# Patient Record
Sex: Male | Born: 1965
Health system: Southern US, Community
[De-identification: ages and names within clinical notes are randomized; demographics above are authoritative.]

## PROBLEM LIST (undated history)

## (undated) DIAGNOSIS — M542 Cervicalgia: Secondary | ICD-10-CM

## (undated) DIAGNOSIS — F4310 Post-traumatic stress disorder, unspecified: Secondary | ICD-10-CM

## (undated) DIAGNOSIS — F32A Depression, unspecified: Secondary | ICD-10-CM

## (undated) DIAGNOSIS — F329 Major depressive disorder, single episode, unspecified: Secondary | ICD-10-CM

## (undated) DIAGNOSIS — F17200 Nicotine dependence, unspecified, uncomplicated: Secondary | ICD-10-CM

## (undated) DIAGNOSIS — G8929 Other chronic pain: Secondary | ICD-10-CM

## (undated) DIAGNOSIS — E782 Mixed hyperlipidemia: Secondary | ICD-10-CM

## (undated) DIAGNOSIS — M549 Dorsalgia, unspecified: Secondary | ICD-10-CM

## (undated) HISTORY — DX: Mixed hyperlipidemia: E78.2

## (undated) HISTORY — DX: Nicotine dependence, unspecified, uncomplicated: F17.200

## (undated) HISTORY — PX: BACK SURGERY: SHX140

## (undated) HISTORY — PX: APPENDECTOMY: SHX54

## (undated) HISTORY — PX: WISDOM TOOTH EXTRACTION: SHX21

## (undated) HISTORY — DX: Major depressive disorder, single episode, unspecified: F32.9

## (undated) HISTORY — DX: Cervicalgia: M54.2

## (undated) HISTORY — DX: Depression, unspecified: F32.A

## (undated) HISTORY — PX: VASECTOMY: SHX75

## (undated) HISTORY — PX: TONSILLECTOMY: SUR1361

---

## 1999-04-12 ENCOUNTER — Encounter (INDEPENDENT_AMBULATORY_CARE_PROVIDER_SITE_OTHER): Payer: Self-pay | Admitting: Specialist

## 1999-04-12 ENCOUNTER — Other Ambulatory Visit: Admission: RE | Admit: 1999-04-12 | Discharge: 1999-04-12 | Payer: Self-pay | Admitting: Specialist

## 2000-01-05 ENCOUNTER — Encounter: Payer: Self-pay | Admitting: Specialist

## 2000-01-06 ENCOUNTER — Encounter (INDEPENDENT_AMBULATORY_CARE_PROVIDER_SITE_OTHER): Payer: Self-pay | Admitting: Specialist

## 2000-01-06 ENCOUNTER — Observation Stay (HOSPITAL_COMMUNITY): Admission: RE | Admit: 2000-01-06 | Discharge: 2000-01-07 | Payer: Self-pay | Admitting: Specialist

## 2000-01-06 ENCOUNTER — Encounter: Payer: Self-pay | Admitting: Specialist

## 2000-06-08 ENCOUNTER — Encounter: Payer: Self-pay | Admitting: Specialist

## 2000-06-09 ENCOUNTER — Inpatient Hospital Stay (HOSPITAL_COMMUNITY): Admission: EM | Admit: 2000-06-09 | Discharge: 2000-06-11 | Payer: Self-pay | Admitting: Specialist

## 2009-06-11 ENCOUNTER — Emergency Department (HOSPITAL_BASED_OUTPATIENT_CLINIC_OR_DEPARTMENT_OTHER): Admission: EM | Admit: 2009-06-11 | Discharge: 2009-06-11 | Payer: Self-pay | Admitting: Emergency Medicine

## 2009-08-23 ENCOUNTER — Encounter
Admission: RE | Admit: 2009-08-23 | Discharge: 2009-11-21 | Payer: Self-pay | Admitting: Physical Medicine & Rehabilitation

## 2009-08-30 ENCOUNTER — Ambulatory Visit: Payer: Self-pay | Admitting: Physical Medicine & Rehabilitation

## 2009-09-28 ENCOUNTER — Ambulatory Visit: Payer: Self-pay | Admitting: Physical Medicine & Rehabilitation

## 2009-10-22 ENCOUNTER — Ambulatory Visit: Payer: Self-pay | Admitting: Physical Medicine & Rehabilitation

## 2009-11-02 ENCOUNTER — Ambulatory Visit: Payer: Self-pay | Admitting: Diagnostic Radiology

## 2009-11-02 ENCOUNTER — Emergency Department (HOSPITAL_BASED_OUTPATIENT_CLINIC_OR_DEPARTMENT_OTHER): Admission: EM | Admit: 2009-11-02 | Discharge: 2009-11-02 | Payer: Self-pay | Admitting: Emergency Medicine

## 2009-11-12 ENCOUNTER — Emergency Department (HOSPITAL_BASED_OUTPATIENT_CLINIC_OR_DEPARTMENT_OTHER): Admission: EM | Admit: 2009-11-12 | Discharge: 2009-11-12 | Payer: Self-pay | Admitting: Emergency Medicine

## 2009-11-12 ENCOUNTER — Emergency Department (HOSPITAL_COMMUNITY): Admission: EM | Admit: 2009-11-12 | Discharge: 2009-11-12 | Payer: Self-pay | Admitting: Emergency Medicine

## 2010-04-21 LAB — BASIC METABOLIC PANEL
CO2: 25 mEq/L (ref 19–32)
Calcium: 10 mg/dL (ref 8.4–10.5)
Calcium: 9.9 mg/dL (ref 8.4–10.5)
Chloride: 109 mEq/L (ref 96–112)
Creatinine, Ser: 0.8 mg/dL (ref 0.4–1.5)
GFR calc Af Amer: >60 mL/min (ref >60)
GFR calc Af Amer: >60 mL/min (ref >60)
GFR calc non Af Amer: >60 mL/min (ref >60)
Sodium: 141 mEq/L (ref 135–145)

## 2010-04-21 LAB — DIFFERENTIAL
Lymphocytes Relative: 24 % (ref 12–46)
Lymphs Abs: 1.8 10*3/uL (ref 0.7–4.0)
Monocytes Relative: 7 % (ref 3–12)
Neutro Abs: 5 10*3/uL (ref 1.7–7.7)
Neutrophils Relative %: 65 % (ref 43–77)

## 2010-04-21 LAB — RAPID URINE DRUG SCREEN, HOSP PERFORMED
Amphetamines: NOT DETECTED
Barbiturates: NOT DETECTED
Benzodiazepines: POSITIVE — AB
Cocaine: NOT DETECTED
Opiates: NOT DETECTED
Tetrahydrocannabinol: NOT DETECTED

## 2010-04-21 LAB — CBC
Hemoglobin: 14.5 g/dL (ref 13.0–17.0)
RBC: 5.23 MIL/uL (ref 4.22–5.81)
WBC: 7.7 10*3/uL (ref 4.0–10.5)

## 2010-04-21 LAB — POCT CARDIAC MARKERS
CKMB, poc: <1 ng/mL — ABNORMAL LOW (ref 1.0–8.0)
Myoglobin, poc: 33.8 ng/mL (ref 12–200)
Myoglobin, poc: 36.5 ng/mL (ref 12–200)

## 2010-04-21 LAB — ETHANOL: Alcohol, Ethyl (B): <5 mg/dL (ref 0–10)

## 2010-06-24 NOTE — Op Note (Signed)
[facility]  Patient:    Karl Jacobs, Karl Jacobs                       MRN: [id number] Proc. Date: 01/06/00 Attending:  Javier Docker, M.D.                           Operative Report  PREOPERATIVE DIAGNOSES:  Recurrent disk herniation L5-S1, right.  POSTOPERATIVE DIAGNOSES:  Recurrent disk herniation L5-S1, right.  PROCEDURE:  Redo microdiskectomy L5-S1, right.  ANESTHESIA:  General.  ASSISTANT:  Avel Peace, P.A.-C.  BRIEF HISTORY:  A 45 year old with recurrent disk herniation confirmed with MRI with refractory S1 radiculopathy. Operative intervention was indicated for decompression of the S1 nerve root by repeat diskectomy. Risks and benefits discussed including bleeding, infection, injury to neurovascular structures, CSF leakage, epidural fibrosis, need for fusion in the future, no change in symptoms, worsening of symptoms, etc.  TECHNIQUE:  The patient in supine position after the induction of general anesthesia, 1 gm of Kefzol IV for antimicrobial prophylaxis. The patient was placed prone on the [place] frame. All bony prominences were well padded. The lumbar region was prepped and draped in the usual sterile fashion. A previous surgical scar was excised. The subcutaneous tissue was dissected, electrocautery was utilized to achieve hemostasis. The dorsolumbar fascia was identified and divided in line with the skin incision. The paraspinous muscle was elevated to the lamina of 4 and 5. Epidural fibrosis was noted. This was delineated with the lamina utilizing a curette so that that cephalad edge of S1 and caudad edge of 5 was skeletonized. The operating microscope was draped and brought onto the surgical field. A Penfield 4 was utilized to gently patch the epidural fibrosis from the facet and the inferior edge of the lamina of 5 as well as the cephalad edge of S1. The laminotomy was enlarged utilizing a 2 mm Kerrison. The S1 nerve root was  identified in the foramen and tracked proximally and was found to be under significant tension. The disk was identified and confirmed with the x-ray and the thecal sac and root were gently mobilized medially utilizing a Penfield 4 gently lysing adhesions for mobilization of the root and the thecal sac. A large herniated nucleus pulposus was noted with slight caudal migration. Annulotomy was performed. Copious portion of disk material was removed from the disk space and further mobilized with a curette and an Epstein. A combination of upbite and straight pituitaries were utilized to retrieve the disk herniation. No residual disk herniation material noted. A hockey stick probe placed in the foramen of 5 and S1 found to be widely patent following decompression. There was no residual pressure on the thecal sac or the S1 nerve root. The disk space and the lamina space was copiously irrigated. Inspection revealed no CSF leakage or active bleeding. The axilla beneath the root into the S1 foramen underneath the thecal sac were examined with no evidence of residual disk material. The nerve root was fully mobilized and free as was the 5 nerve root. The wound was copiously irrigated. Thrombin soaked Gelfoam was placed in the laminotomy defect. The McCullough retractor was removed, paraspinous muscles inspected with no evidence of active bleeding. The dorsolumbar fascia was reapproximated with #1 Vicryl interrupted figure-of-eight sutures. The subcutaneous tissue was reapproximated with 2-0 Vicryl simple suture. The skin was reapproximated with 3-0 subcuticular PDS. The wounds were reinforced with Steri-Strips. A sterile dressing was applied.  The patient was placed supine on the hospital bed and extubated without difficulty, transported to the recovery room in satisfactory condition.  The patient tolerated the procedure well with no complications. DD:  01/06/00 TD:  01/06/00 Job:  [id number]

## 2010-06-24 NOTE — Op Note (Signed)
[facility]  Patient:    Karl Jacobs, Karl Jacobs                 MRN: [id number] Proc. Date: 06/08/00 Adm. Date:  [id number] Attending:  Pierce Crane                           Operative Report  PREOPERATIVE DIAGNOSES:  Herniated nucleus pulposus L5-S1, foraminal stenosis L5 right.  POSTOPERATIVE DIAGNOSES:  Herniated nucleus pulposus L5-S1, foraminal stenosis L5 right.  PROCEDURE:  Redo microdiskectomy L5-S1 right, lateral recess decompression with foraminotomy of L5.  ANESTHESIA:  General.  ASSISTANT:  Georges Lynch. Gioffre, M.D.  BRIEF HISTORY AND INDICATIONS:  A 45 year old with refractory S1 and L5 radicular pain following a diskectomy.  The patient had a postoperative pain-free interval followed by symptoms of flu with nausea and vomiting, recurrent right lower extremity radicular pain.  MRI indicating small recurrent disk herniation, epithelial fibrosis, failed conservative treatment including physical therapy, analgesics, and selective nerve root block. Operative intervention is indicated for decompression of the S1 and probably the L5 root given the foraminal stenosis.  Risks and benefits discussed including bleeding, infection, damage to vascular structures, CSF leak, no change in symptoms, worsening symptoms, need for fusion in the future discussed at length.  DESCRIPTION OF PROCEDURE:  The patient is in supine position.  After the induction of adequate general anesthesia, 1 gram of Kefzol, the patient placed prone on the [place] frame.  All bony prominences well padded.  Lumbar region is prepped and draped in the usual sterile fashion.  The previous surgical wound was excised.  Subcutaneous tissue dissected.  Electrocautery was utilized to achieve hemostasis.  The dorsolumbar fascia identified and divided in line with the skin incision.  Paraspinous muscles elevated from the lamina of L5-S1 on the right.  McCullough retractor was  placed.  Operating microscope was draped and brought onto the surgical field and confirmatory radiograph obtained with Penfield 4 over the body of S1.  A curette was utilized to test the epithelial fibrosis from the caudad edge of L5 and cephalad to edge of S1. The previous laminotomy was skeletonized.  Next the 2 mm Kerrison was then utilized to enlarge the laminotomy of both the S1 vertebral body as well as on the inferior aspect of L5.  High speed bur was then utilized to increase the hemilaminotomy and a partial medial hemifacetectomy.  First the S1 nerve root was identified, and the foramen of S1 pedicle was noted.  There was a large synovial cyst vaginating into the lateral recess from the L5-S1 facet.  The Penfield 4 was utilized to mobilize the S1 nerve root medially from the pedicle.  A hockey stick probe was attempted to pass out to the foramen of L5, unable to pass due to significant stenosis secondary to ligamentum flavum hypertrophy, in-folding, and bony stenosis.  A 10 mm Kerrison was utilized to perform a foraminotomy, decompressing the L5 root.  Removed the superior portion of the articulating process of S1.  The thecal sac was identified and gently mobilized medially and with some resistance, removed.  Penfield 4 was utilized to identify the disk.  With the neural elements well protected, a hemilaminotomy was performed and copious portions of disk material was removed from the disk space.  A small extruded fragment was retrieved.  Following this, there was no residual disk herniation noted.  The hockey stick probe placed freely in the foramen of S1  and L5.  The wound was copiously irrigated bipolar electrocautery had been utilized to achieve hemostasis.  The disk space was copiously irrigated.  Inspection revealed no evidence of active bleeding or CSF leakage.  Thrombin soaked Gelfoam was placed in the laminotomy defect.  McCullough retractor was removed.  The paraspinous  muscles were inspected with no evidence of active bleeding.  The dorsolumbar fascia was then reapproximated with #1 Vicryl interrupted figure-of-eight sutures.  The subcutaneous tissue reapproximated with 2-0 Vicryl simple sutures.  The skin was reapproximated with staples, and the wound was dressed sterilely.  The patient was placed supine on the hospital bed and extubated without difficulty and transported to the recovery room in satisfactory condition.  The patient tolerated the procedure well with no apparent complications.  Blood loss was minimal. DD:  06/08/00 TD:  06/08/00 Job: [id number]

## 2010-06-24 NOTE — Op Note (Signed)
[facility]  Patient:    Karl Jacobs, Karl Jacobs                       MRN: [id number] Proc. Date: 01/06/00 Attending:  Javier Docker, M.D.                           Operative Report  PREOPERATIVE DIAGNOSES:  Disk herniation L5-S1, right.  POSTOPERATIVE DIAGNOSES:  Disk herniation L5-S1, right.  PROCEDURE:  Redo microdiskectomy L5-S1, right.  ANESTHESIA:  General.  ASSISTANT:  Perkins.  DICTATION ENDED HERE... DD:  01/06/00 TD:  01/06/00 Job: [id number]

## 2011-09-04 ENCOUNTER — Encounter (HOSPITAL_COMMUNITY): Payer: Self-pay | Admitting: Pharmacy Technician

## 2011-09-15 ENCOUNTER — Other Ambulatory Visit (HOSPITAL_COMMUNITY): Payer: Self-pay

## 2011-10-02 DIAGNOSIS — G894 Chronic pain syndrome: Secondary | ICD-10-CM | POA: Diagnosis not present

## 2011-10-02 NOTE — H&P (Signed)
JEFFERY S. Takeda 10/02/2011 8:45 AM Location: [facility] Patient #: [id number] DOB: Jul 01, 1965 Married / Language: [place] / Race: White Male   History of Present Illness(Ankit Degregorio Dierdre Highman, PA-C; 10/02/2011 8:47 AM) The patient is a 46 year old male who comes in today for a preoperative History and Physical. The patient is scheduled for a SCS placement (for chronic pain) to be performed by Dr. Debria Garret D. Shon Baton, MD at [facility] on Thursday, October 19, 2011 at 0730 .    Allergies(Lori W Randa Lynn; 10/02/2011 11:45 AM) No Known Drug Allergies. 10/29/2010   Family History(Baird Polinski J Azie Mcconahy, PA-C; 10/03/2011 8:10 AM) Diabetes Mellitus. father Cancer. father Hypertension. father Rheumatoid Arthritis. mother   Social History(Javin Nong J [facility], PA-C; 10/03/2011 8:10 AM) Number of flights of stairs before winded. greater than 5, 4-5 Marital status. married Living situation. live with spouse Tobacco use. current some days smoker; smoke(d) less than 1/2 pack(s) per day, current every day smoker; smoke(d) 1/2 pack(s) per day Tobacco / smoke exposure. yes outdoors only Pain Contract. no Illicit drug use. no Copy of Drug/Alcohol Rehab (Previously). no Children. 2, 3 Alcohol use. former drinker Exercise. Exercises weekly; does other, Exercises weekly; does running / walking and gym / weights Drug/Alcohol Rehab (Currently). no Current work status. working full time   Medication History(Lori W Randa Lynn; 10/02/2011 11:46 AM) Morphine Sulfate ER (30MG  Tablet ER, 1 (one) Tablet ER Oral three times daily, Taken starting 09/20/2011) Active. Percocet (10-325MG  Tablet, 1 Oral every six hours, as needed, Taken starting 09/08/2011) Active. TraZODone HCl (50MG  Tablet, 1 Oral at bedtime, Taken starting 09/21/2011) Active.   Past Surgical History(Jackelyn Illingworth J [facility], PA-C; 10/03/2011 8:10 AM) Tonsillectomy Vasectomy Spinal Surgery. x3 Appendectomy Other Orthopaedic  Surgery   Other Problems(Blayton Huttner J [facility], PA-C; 10/03/2011 8:10 AM) Chronic Pain   Review of Systems(Marykay Mccleod J Jerrel Tiberio, PA-C; 10/03/2011 8:10 AM) General:Not Present- Chills, Fever, Night Sweats, Appetite Loss, Fatigue, Feeling sick, Weight Gain and Weight Loss. Skin:Not Present- Itching, Rash, Skin Color Changes, Ulcer, Psoriasis and Change in Hair or Nails. HEENT:Not Present- Sensitivity to light, Hearing problems, Nose Bleed and Ringing in the Ears. Neck:Not Present- Swollen Glands and Neck Mass. Respiratory:Not Present- Snoring, Chronic Cough, Bloody sputum and Dyspnea. Cardiovascular:Not Present- Shortness of Breath, Chest Pain, Swelling of Extremities, Leg Cramps and Palpitations. Gastrointestinal:Not Present- Bloody Stool, Heartburn, Abdominal Pain, Vomiting, Nausea and Incontinence of Stool. Musculoskeletal:Present- Muscle Weakness and Back Pain. Not Present- Muscle Pain, Joint Stiffness, Joint Swelling and Joint Pain. Neurological:Present- Numbness and Burning. Not Present- Tingling, Tremor, Headaches and Dizziness. Psychiatric:Not Present- Anxiety, Depression and Memory Loss. Endocrine:Not Present- Cold Intolerance, Heat Intolerance, Excessive hunger and Excessive Thirst. Hematology:Not Present- Abnormal Bleeding, Anemia, Blood Clots and Easy Bruising.   Vitals(Lori W Lamb; 10/02/2011 10:48 AM) 10/02/2011 10:46 AM Weight: 185 lb Height: 72 in Body Surface Area: 2.06 m Body Mass Index: 25.09 kg/m Pulse: 70 (Regular) BP: 107/65 (Sitting, Left Arm, Standard)    Physical Exam(Marlyn Rabine J Kamala Kolton, PA-C; 10/03/2011 8:16 AM) The physical exam findings are as follows:   General General Appearance- pleasant. Not in acute distress. Orientation- Oriented X3. Build & Nutrition- Well nourished and Well developed. Posture- Normal posture. Gait- Normal. Mental Status- Alert.   Integumentary Thoracic Spine- Skin examination of the thoracic spine is  without deformity, skin lesions, lacerations or abrasions. Lumbar Spine- Skin examination of the lumbar spine is without deformity, skin lesions, lacerations or abrasions.   Head and Neck Neck Global Assessment- supple. no lymphadenopathy and no nucchal rigidty.   Eye Pupil-  Bilateral- Normal, Direct reaction to light normal, Equal and Regular. Motion- Bilateral- EOMI.   Chest and Lung Exam Auscultation: Breath sounds:- Clear.   Cardiovascular Auscultation:Rhythm- Regular rate and rhythm. Heart Sounds- Normal heart sounds.   Abdomen Palpation/Percussion:Palpation and Percussion of the abdomen reveal - Non Tender, No Rebound tenderness and Soft.   Peripheral Vascular Lower Extremity:Inspection- Bilateral- Inspection Normal. Palpation:Posterior tibial pulse- Bilateral- 2+. Dorsalis pedis pulse- Bilateral- 2+.   Neurologic Sensation:Lower Extremity- Bilateral- sensation is intact in the lower extremity. Reflexes:Patellar Reflex- Bilateral- 1+. Achilles Reflex- Bilateral- 1+. Babinski- Bilateral- Babinski not present. Clonus- Bilateral- clonus not present. Hoffman's Sign- Bilateral- Hoffman's sign not present. Testing:Seated Straight Leg Raise- Bilateral- Seated straight leg raise negative.   Musculoskeletal Spine/Ribs/Pelvis Lumbosacral Spine:Inspection and Palpation- Tenderness- generalized. bony and soft tissue palpation of the lumbar spine and SI joint does not recreate their typical pain. Strength and Tone: Strength:Hip Flexion- Bilateral- 5/5. Knee Extension- Bilateral- 5/5. Knee Flexion- Bilateral- 5/5. Ankle Dorsiflexion- Bilateral- 5/5. Ankle Plantarflexion- Bilateral- 5/5. Heel walk- Bilateral- able to heel walk without difficulty. Toe Walk- Bilateral- able to walk on toes without difficulty. Heel-Toe Walk- Bilateral- able to heel-toe walk without difficulty. ROM- Flexion- moderately decreased range of  motion and painful. Extension- full range of motion and painful. Pain:- neither flexion or extension is more painful than the other. Waddell's Signs- no Waddell's signs present. Lower Extremity Range of Motion:- No true hip, knee or ankle pain with range of motion. Gait and Station:Assistive Devices- no assistive devices.   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 Risks/benefits/alternatives to the procedure/expectations following the procedure have been reviewed with him by Dr. Shon Baton. The goal of surgery is to reduce, not eliminate his pain. He understands and accepts.  He is scheduled to complete his preop hospital requirements on 10/13/11 at [facility].   MRI of the thoracic spine and lumbar spine have been reviewed by Dr. Shon Baton. The thoracic spine demonstrates no abnormal areas of cord enhancement/ no cord lesion visualized. 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 Please see the report for the remaining specifics of this and the lumbar spine. All of his questions have been encouraged, addressed and answered. Plan, at this time is to proceed with surgery as scheduled   Signed electronically by Gwinda Maine, PA-C (10/03/2011 8:18 AM)  Leotis Shames S. Holcomb 07/28/2011 1:07 PM Location: [facility] Patient #: [id number] DOB: 1965-02-28 Married / Language: [place] / Race: White Male   History of Present Illness(Kerri W Maze; 07/28/2011 1:09 PM) The patient is a 46 year old male who presents today for follow up of their back. The patient is being followed for their central back pain.  They are now month(s) out. Symptoms reported today include: pain. The patient states that they are doing poorly. The following medication has been used for pain control: Morphine, Percocet and trazadone. The patient reports their current pain level to be moderate to severe. The patient presents today following MRI.    Subjective Transcription(DAHARI Sheela Stack, MD; 08/01/2011 2:41 PM)  He returns today for follow up. 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 The largest diameter is a  T7, where it measures 3x3 mm. There are no other intraspinal lesions, no abnormal enhancement following Gadolinium. There is no evidence of bone lesions.    Allergies(Kerri W Maze; 07/28/2011 1:07 PM) No Known Drug Allergies. 10/29/2010   Social History(Kerri W Maze; 07/28/2011 1:08 PM) Number of flights of stairs before winded. greater than 5 Marital status. married Living situation. live with spouse Tobacco use. current some days smoker; smoke(d) less than 1/2 pack(s) per day Tobacco / smoke exposure. yes outdoors only Pain Contract. no Illicit drug use. no Copy of Drug/Alcohol Rehab (Previously). no Children. 2 Alcohol use. former drinker Exercise. Exercises weekly; does other Drug/Alcohol Rehab (Currently). no Current work status. working full time   Medication History(Kerri W Maze; 07/28/2011 1:08 PM) Percocet (10-325MG  Tablet, 1 Oral four times daily, as needed, Taken starting 07/10/2011) Active. Morphine Sulfate ER (30MG  Tablet ER 12HR, 1 Oral q 12 hrs, Taken starting 07/10/2011) Active. TraZODone HCl (50MG  Tablet, 1 Oral at bedtime, Taken starting 07/10/2011) Active.   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 He has no significant stenosis. There is a risk if he gets a CSF leak that the syrinx can become a significant problem. I've gone over the risks which include infection, bleeding, nerve damage, need for more surgery because of the syrinx, or lead migration, ongoing or worse pain, infection, death, stroke or paralysis. All of his questions were addressed. We will plan on proceeding with a permanent implant in the very near future.    Miscellaneous Transcription(DAHARI Sheela Stack, MD; 08/01/2011 2:41 PM)  Venita Lick, M. D./slk    T: 08-01-11  D: 07-28-11      Signed electronically by Alvy Beal, MD (08/01/2011 4:39 PM)

## 2011-10-12 NOTE — Pre-Procedure Instructions (Signed)
19 Karl Jacobs   10/12/2011   Your procedure is scheduled on: September 12th, Thursday  Report to [facility] at  5:30 AM.               Surgery time is posted from 7:30 AM to 10:00 AM   Call this number if you have problems the morning of surgery: [phone]   Remember:   Do not eat food or drink any liquids:After Midnight Wednesday.    Take these medicines the morning of surgery with A SIP OF WATER: May take either the morphine OR the Percocet   Do not wear jewelry.  Do not wear lotions, powders, or perfumes. You may NOT wear deodorant.    Men may shave face and neck.  Do not bring any valuables to the hospital.    Contacts, dentures or bridgework may not be worn into surgery.  Leave suitcase in the car. After surgery it may be brought to your room.   For patients admitted to the hospital, checkout time is 11:00 AM the day of discharge.   Patients discharged the day of surgery will not be allowed to drive home. You will need someone             To stay with you for the first 24 hrs.   Name and phone number of your driver:     Special Instructions: CHG Shower Use Special Wash: 1/2 bottle night before surgery and 1/2 bottle morning of surgery.   Please read over the following fact sheets that you were given: Pain Booklet, MRSA Information and Surgical Site Infection Prevention

## 2011-10-13 ENCOUNTER — Encounter (HOSPITAL_COMMUNITY)
Admission: RE | Admit: 2011-10-13 | Discharge: 2011-10-13 | Disposition: A | Discharge status: Home / Self Care | Payer: Managed Care, Other (non HMO) | Source: Ambulatory Visit | Attending: Orthopedic Surgery | Admitting: Orthopedic Surgery

## 2011-10-13 ENCOUNTER — Encounter (HOSPITAL_COMMUNITY): Payer: Self-pay

## 2011-10-13 HISTORY — DX: Post-traumatic stress disorder, unspecified: F43.10

## 2011-10-13 LAB — CBC
HCT: 45.9 % (ref 39.0–52.0)
Hemoglobin: 15 g/dL (ref 13.0–17.0)
MCH: 28.1 pg (ref 26.0–34.0)
MCHC: 32.7 g/dL (ref 30.0–36.0)
RBC: 5.33 MIL/uL (ref 4.22–5.81)

## 2011-10-13 NOTE — Progress Notes (Addendum)
1530  Friday....the patient HAS STATED THAT BACK IN 2004, HE WAS SENT OVER TO [facility] [place].Marland KitchenMarland KitchenIT WAS DETERMINED THAT IT WAS MAINLY A PANIC ATTACK..  PT WAS GOING THROUGH A LOT OF PERSONAL PROBLEMS.Marland KitchenDIVORCE, ETC..... HAD ANOTHER BOUT IN 2011, BUT WORK UP (EKG, LAB WORK,  CARDIAC ENZYMES WERE NEGATIVE) INDICATED IT WASN'T AN HEART ISSUE.Marland KitchenANOTHER PANIC ATTACK.  HE HAS NOT SEEN A CARDIOLOGIST THO. WIFE HAS DELIVERED A PREEMIE....THE FRATERNAL BOY HAS DIED, BUT THE LITTLE GIRL IS AT HOME FINALLY AFTER 3 MTHS IN THE HOSPITAL..... NOTED ALSO, THAT PATIENT WAS TRYING TO COME OFF NARCOTICS 'COLD [place]' AND HAD TO BE SEEN IN ER  AT  [facility] ON OCCASION.  DA

## 2011-10-17 ENCOUNTER — Emergency Department (HOSPITAL_BASED_OUTPATIENT_CLINIC_OR_DEPARTMENT_OTHER)
Admission: EM | Admit: 2011-10-17 | Discharge: 2011-10-17 | Disposition: A | Discharge status: Home / Self Care | Payer: Managed Care, Other (non HMO) | Attending: Emergency Medicine | Admitting: Emergency Medicine

## 2011-10-17 ENCOUNTER — Encounter (HOSPITAL_BASED_OUTPATIENT_CLINIC_OR_DEPARTMENT_OTHER): Payer: Self-pay | Admitting: Emergency Medicine

## 2011-10-17 DIAGNOSIS — F17200 Nicotine dependence, unspecified, uncomplicated: Secondary | ICD-10-CM | POA: Insufficient documentation

## 2011-10-17 DIAGNOSIS — R197 Diarrhea, unspecified: Secondary | ICD-10-CM

## 2011-10-17 DIAGNOSIS — F19939 Other psychoactive substance use, unspecified with withdrawal, unspecified: Secondary | ICD-10-CM | POA: Insufficient documentation

## 2011-10-17 DIAGNOSIS — F1120 Opioid dependence, uncomplicated: Secondary | ICD-10-CM | POA: Insufficient documentation

## 2011-10-17 DIAGNOSIS — F1193 Opioid use, unspecified with withdrawal: Secondary | ICD-10-CM

## 2011-10-17 DIAGNOSIS — F4310 Post-traumatic stress disorder, unspecified: Secondary | ICD-10-CM | POA: Insufficient documentation

## 2011-10-17 DIAGNOSIS — F1123 Opioid dependence with withdrawal: Secondary | ICD-10-CM

## 2011-10-17 DIAGNOSIS — R112 Nausea with vomiting, unspecified: Secondary | ICD-10-CM

## 2011-10-17 HISTORY — DX: Other chronic pain: G89.29

## 2011-10-17 HISTORY — DX: Dorsalgia, unspecified: M54.9

## 2011-10-17 LAB — COMPREHENSIVE METABOLIC PANEL WITH GFR
AST: 18 U/L (ref 0–37)
Albumin: 4.5 g/dL (ref 3.5–5.2)
Alkaline Phosphatase: 66 U/L (ref 39–117)
Chloride: 101 meq/L (ref 96–112)
Potassium: 4 meq/L (ref 3.5–5.1)
Total Bilirubin: 0.5 mg/dL (ref 0.3–1.2)

## 2011-10-17 LAB — COMPREHENSIVE METABOLIC PANEL
ALT: 17 U/L (ref 0–53)
BUN: 20 mg/dL (ref 6–23)
CO2: 25 mEq/L (ref 19–32)
Calcium: 10.4 mg/dL (ref 8.4–10.5)
Creatinine, Ser: 1 mg/dL (ref 0.50–1.35)
GFR calc Af Amer: >90 mL/min (ref >90)
GFR calc non Af Amer: 88 mL/min — ABNORMAL LOW (ref >90)
Glucose, Bld: 119 mg/dL — ABNORMAL HIGH (ref 70–99)
Sodium: 140 mEq/L (ref 135–145)
Total Protein: 7.3 g/dL (ref 6.0–8.3)

## 2011-10-17 LAB — CBC WITH DIFFERENTIAL/PLATELET
Basophils Absolute: 0 K/uL (ref 0.0–0.1)
Basophils Relative: 0 % (ref 0–1)
Eosinophils Absolute: 0 10*3/uL (ref 0.0–0.7)
Eosinophils Relative: 0 % (ref 0–5)
HCT: 47.9 % (ref 39.0–52.0)
Hemoglobin: 16.5 g/dL (ref 13.0–17.0)
Lymphocytes Relative: 15 % (ref 12–46)
Lymphs Abs: 1.8 10*3/uL (ref 0.7–4.0)
MCH: 28.4 pg (ref 26.0–34.0)
MCHC: 34.4 g/dL (ref 30.0–36.0)
MCV: 82.3 fL (ref 78.0–100.0)
Monocytes Absolute: 0.5 10*3/uL (ref 0.1–1.0)
Monocytes Relative: 4 % (ref 3–12)
Neutro Abs: 9.6 K/uL — ABNORMAL HIGH (ref 1.7–7.7)
Neutrophils Relative %: 81 % — ABNORMAL HIGH (ref 43–77)
Platelets: 209 10*3/uL (ref 150–400)
RBC: 5.82 MIL/uL — ABNORMAL HIGH (ref 4.22–5.81)
RDW: 13.3 % (ref 11.5–15.5)
WBC: 11.9 K/uL — ABNORMAL HIGH (ref 4.0–10.5)

## 2011-10-17 MED ORDER — ONDANSETRON 8 MG PO TBDP: 8.0000 mg | ORAL_TABLET | Freq: Two times a day (BID) | ORAL | Status: AC | PRN

## 2011-10-17 MED ORDER — ONDANSETRON HCL 4 MG/2ML IJ SOLN
4.0000 mg | Freq: Once | INTRAMUSCULAR | Status: AC
  Administered 2011-10-17: 4 mg via INTRAVENOUS

## 2011-10-17 MED ORDER — HYDROMORPHONE HCL PF 2 MG/ML IJ SOLN
2.0000 mg | Freq: Once | INTRAMUSCULAR | Status: AC
  Administered 2011-10-17: 2 mg via INTRAVENOUS

## 2011-10-17 MED ORDER — OXYCODONE-ACETAMINOPHEN 10-325 MG PO TABS: 1.0000 | ORAL_TABLET | ORAL | Status: DC | PRN

## 2011-10-17 MED ORDER — ONDANSETRON HCL 4 MG/2ML IJ SOLN
INTRAMUSCULAR | Status: AC
  Filled 2011-10-17: qty 2

## 2011-10-17 MED ORDER — SODIUM CHLORIDE 0.9 % IV BOLUS (SEPSIS)
1000.0000 mL | Freq: Once | INTRAVENOUS | Status: AC
  Administered 2011-10-17: 1000 mL via INTRAVENOUS

## 2011-10-17 MED ORDER — OXYCODONE-ACETAMINOPHEN 5-325 MG PO TABS
2.0000 | ORAL_TABLET | Freq: Once | ORAL | Status: AC
  Administered 2011-10-17: 2 via ORAL
  Filled 2011-10-17 (×2): qty 2

## 2011-10-17 MED ORDER — HYDROMORPHONE HCL PF 2 MG/ML IJ SOLN
INTRAMUSCULAR | Status: AC
  Filled 2011-10-17: qty 1

## 2011-10-17 NOTE — ED Notes (Signed)
Pt with multiple episodes of vomiting and diarrhea since sun night. Pt was seen by PMD mon afternoon and received IM phenergan but no rx for nausea medication. Pt also c/o abd pain.

## 2011-10-17 NOTE — ED Provider Notes (Signed)
History     CSN: [id number]  Arrival date & time 10/17/11  4098   First MD Initiated Contact with Patient 10/17/11 0445      Chief Complaint  Patient presents with  . Emesis  . Diarrhea    (Consider location/radiation/quality/duration/timing/severity/associated sxs/prior treatment) HPI Comments: Pt with h/o chronic back pain who has been on morphine 30 mg TID and percocet 10 mg QID for years, reports he ate some [place] food on Sunday and soon after began having profuse N/V.  He saw PMD yesterday and received IM phenergan, slept some and felt a little better, but only temporary, has now continued to have N/V and now profuse diarrhea as well.  He has not been able to keep down his pain meds since symptoms began.  He is beginning to hurt all over.  No recent abd use, no foreign travel, no obv sick contacts.    Patient is a 46 y.o. male presenting with vomiting and diarrhea. The history is provided by the patient and a relative.  Emesis  Associated symptoms include abdominal pain, diarrhea and myalgias. Pertinent negatives include no chills, no fever and no headaches.  Diarrhea The primary symptoms include fatigue, abdominal pain, nausea, vomiting, diarrhea and myalgias. Primary symptoms do not include fever.  The illness is also significant for back pain. The illness does not include chills.    Past Medical History  Diagnosis Date  . Post traumatic stress disorder (PTSD)     H/O .Marland Kitchen...  "FELT BETTER IN 2006"  . Chronic back pain     Past Surgical History  Procedure Date  . Back surgery     X 3  . Tonsillectomy   . Appendectomy   . Vasectomy     & REVERSAL IN 2008    History reviewed. No pertinent family history.  History  Substance Use Topics  . Smoking status: Current Everyday Smoker -- 1.0 packs/day for 30 years    Types: Cigarettes  . Smokeless tobacco: Not on file  . Alcohol Use: No      Review of Systems  Constitutional: Positive for fatigue. Negative for  fever and chills.  Respiratory: Negative for shortness of breath.   Cardiovascular: Negative for chest pain.  Gastrointestinal: Positive for nausea, vomiting, abdominal pain and diarrhea. Negative for blood in stool.  Musculoskeletal: Positive for myalgias and back pain.  Neurological: Positive for light-headedness. Negative for syncope and headaches.  All other systems reviewed and are negative.    Allergies  Review of patient's allergies indicates no known allergies.  Home Medications   Current Outpatient Rx  Name Route Sig Dispense Refill  . MORPHINE SULFATE 30 MG PO TABS Oral Take 30 mg by mouth 3 (three) times daily.    Marland Kitchen ONDANSETRON 8 MG PO TBDP Oral Take 1 tablet (8 mg total) by mouth every 12 (twelve) hours as needed for nausea. 20 tablet 0  . OXYCODONE-ACETAMINOPHEN 10-325 MG PO TABS Oral Take 1 tablet by mouth 4 (four) times daily.    . OXYCODONE-ACETAMINOPHEN 10-325 MG PO TABS Oral Take 1 tablet by mouth every 4 (four) hours as needed for pain. 15 tablet 0  . TRAZODONE HCL 50 MG PO TABS Oral Take 50 mg by mouth at bedtime.      BP 137/93  Pulse 58  Temp 98.2 F (36.8 C) (Oral)  Resp 18  SpO2 98%  Physical Exam  Nursing note and vitals reviewed. Constitutional: He is oriented to person, place, and time. He appears well-developed  and well-nourished.  HENT:  Head: Normocephalic and atraumatic.  Eyes: Pupils are equal, round, and reactive to light. No scleral icterus.  Neck: Normal range of motion. Neck supple.  Cardiovascular: Normal rate and regular rhythm.   Pulmonary/Chest: Effort normal and breath sounds normal. No respiratory distress. He has no wheezes.  Abdominal: Soft. He exhibits no distension. There is no tenderness.  Musculoskeletal: He exhibits no edema.       Lumbar back: He exhibits decreased range of motion, tenderness, pain and spasm. He exhibits no bony tenderness, no deformity and normal pulse.  Neurological: He is alert and oriented to person,  place, and time. He has normal strength and normal reflexes. No sensory deficit. He exhibits normal muscle tone. Coordination normal.  Skin: Skin is warm and dry.    ED Course  Procedures (including critical care time)  Labs Reviewed  CBC WITH DIFFERENTIAL - Abnormal; Notable for the following:    WBC 11.9 (*)     RBC 5.82 (*)     Neutrophils Relative 81 (*)     Neutro Abs 9.6 (*)     All other components within normal limits  COMPREHENSIVE METABOLIC PANEL - Abnormal; Notable for the following:    Glucose, Bld 119 (*)     GFR calc non Af Amer 88 (*)     All other components within normal limits   No results found.   1. Nausea vomiting and diarrhea   2. Withdrawal from opioids     RA sat is 98% and I interpret to be normal.   6:35 AM Pt has some back spasms, but overall feels improved, has kept down gingerale.  Pt expects to see Dr. Ethelene Hal, his main ortho/pain specialist later today and can get refills of his main meds.  Will give prescription for some pain control here.   MDM  Pt may have had food poisoning or viral, however now likely is also having some withdrawal symptoms from not being able to take his narcotic medication.  Will give IVF's, IV narcotics, IV zofran and monitor.  WBC mildly up is non specific.          Gavin Pound. Oletta Lamas, MD 10/17/11 [phone]

## 2011-10-17 NOTE — ED Notes (Signed)
Pt reports nausea is returning 

## 2011-10-17 NOTE — Discharge Instructions (Signed)
 Narcotic Withdrawal If you take narcotic drugs for a long time, you may become dependent on them. Stopping these medicines suddenly can cause physical symptoms of withdrawal. Narcotics include opiate prescription pain medicines and heroin. Commonly prescribed narcotics include codeine , hydrocodone, oxycodone , methadone, and morphine . SYMPTOMS  Narcotics tend to slow down body and mental function. When you quit taking narcotics, your body and mind are stimulated. Some withdrawal symptoms include:  Irritability.   Anxiety.   Runny nose.   Goose flesh.   Diarrhea.   Nausea.   Muscle spasms.   Sleeplessness.   Chills.   Sweats.   Drug cravings.   Confusion.  Withdrawal symptoms are troubling. The severity depends on:  Your body's make up.   The amount of drugs you used.   The length of time you used them.  You may be at greater risk of having twitching and shaking (seizure) during the first several days of withdrawal from sedative drugs, including narcotics. However, opiate withdrawal rarely causes a seizure. Withdrawal is uncomfortable, but it is not life-threatening for adults unless there is a medical complication, such as heart disease. HOME CARE INSTRUCTIONS   Drink fluids, get plenty of rest, and take hot baths.   Medicines may be prescribed to help control withdrawal symptoms.   Over-the-counter medicines may be helpful to control diarrhea or an upset stomach.   If your problems resulted from taking prescription pain medicines, make sure you have a follow-up visit with your caregiver within the next few days. Be open about this problem.   Have someone with you to monitor your symptoms.  SEEK IMMEDIATE MEDICAL CARE IF:   You have vomiting that cannot be controlled, especially if you cannot keep liquids down.   You are seeing things or hearing voices that are not really there (hallucinating).   You have a seizure.  Document Released: 03/02/2004 Document  Revised: 01/12/2011 Document Reviewed: 06/25/2009 [facility] Patient Information 2012 [place], [place].    Nausea and Vomiting Nausea is a sick feeling that often comes before throwing up (vomiting). Vomiting is a reflex where stomach contents come out of your mouth. Vomiting can cause severe loss of body fluids (dehydration). Children and elderly adults can become dehydrated quickly, especially if they also have diarrhea. Nausea and vomiting are symptoms of a condition or disease. It is important to find the cause of your symptoms. CAUSES   Direct irritation of the stomach lining. This irritation can result from increased acid production (gastroesophageal reflux disease), infection, food poisoning, taking certain medicines (such as nonsteroidal anti-inflammatory drugs), alcohol use, or tobacco use.   Signals from the brain.These signals could be caused by a headache, heat exposure, an inner ear disturbance, increased pressure in the brain from injury, infection, a tumor, or a concussion, pain, emotional stimulus, or metabolic problems.   An obstruction in the gastrointestinal tract (bowel obstruction).   Illnesses such as diabetes, hepatitis, gallbladder problems, appendicitis, kidney problems, cancer, sepsis, atypical symptoms of a heart attack, or eating disorders.   Medical treatments such as chemotherapy and radiation.   Receiving medicine that makes you sleep (general anesthetic) during surgery.  DIAGNOSIS Your caregiver may ask for tests to be done if the problems do not improve after a few days. Tests may also be done if symptoms are severe or if the reason for the nausea and vomiting is not clear. Tests may include:  Urine tests.   Blood tests.   Stool tests.   Cultures (to look for evidence of infection).  X-rays or other imaging studies.  Test results can help your caregiver make decisions about treatment or the need for additional tests. TREATMENT You need to stay well  hydrated. Drink frequently but in small amounts.You may wish to drink water, sports drinks, clear broth, or eat frozen ice pops or gelatin dessert to help stay hydrated.When you eat, eating slowly may help prevent nausea.There are also some antinausea medicines that may help prevent nausea. HOME CARE INSTRUCTIONS   Take all medicine as directed by your caregiver.   If you do not have an appetite, do not force yourself to eat. However, you must continue to drink fluids.   If you have an appetite, eat a normal diet unless your caregiver tells you differently.   Eat a variety of complex carbohydrates (rice, wheat, potatoes, bread), lean meats, yogurt, fruits, and vegetables.   Avoid high-fat foods because they are more difficult to digest.   Drink enough water and fluids to keep your urine clear or pale yellow.   If you are dehydrated, ask your caregiver for specific rehydration instructions. Signs of dehydration may include:   Severe thirst.   Dry lips and mouth.   Dizziness.   Dark urine.   Decreasing urine frequency and amount.   Confusion.   Rapid breathing or pulse.  SEEK IMMEDIATE MEDICAL CARE IF:   You have blood or brown flecks (like coffee grounds) in your vomit.   You have black or bloody stools.   You have a severe headache or stiff neck.   You are confused.   You have severe abdominal pain.   You have chest pain or trouble breathing.   You do not urinate at least once every 8 hours.   You develop cold or clammy skin.   You continue to vomit for longer than 24 to 48 hours.   You have a fever.  MAKE SURE YOU:   Understand these instructions.   Will watch your condition.   Will get help right away if you are not doing well or get worse.  Document Released: 01/23/2005 Document Revised: 01/12/2011 Document Reviewed: 06/22/2010 [facility] Patient Information 2012 [place], [place].

## 2011-10-17 NOTE — ED Notes (Signed)
Pt reports feeling better. No nausea after drinking ginger ale.

## 2011-10-17 NOTE — ED Notes (Signed)
Pt given ginger ale for po challenge 

## 2011-10-18 MED ORDER — CEFAZOLIN SODIUM-DEXTROSE 2-3 GM-% IV SOLR
2.0000 g | INTRAVENOUS | Status: AC
  Administered 2011-10-19: 2 g via INTRAVENOUS
  Filled 2011-10-18: qty 50

## 2011-10-19 ENCOUNTER — Encounter (HOSPITAL_COMMUNITY): Payer: Self-pay | Admitting: Anesthesiology

## 2011-10-19 ENCOUNTER — Ambulatory Visit (HOSPITAL_COMMUNITY): Payer: Managed Care, Other (non HMO)

## 2011-10-19 ENCOUNTER — Ambulatory Visit (HOSPITAL_COMMUNITY)
Admission: RE | Admit: 2011-10-19 | Discharge: 2011-10-20 | Disposition: A | Discharge status: Home / Self Care | Payer: Managed Care, Other (non HMO) | Source: Ambulatory Visit | Attending: Orthopedic Surgery | Admitting: Orthopedic Surgery

## 2011-10-19 ENCOUNTER — Encounter (HOSPITAL_COMMUNITY)
Admission: RE | Disposition: A | Discharge status: Home / Self Care | Payer: Self-pay | Source: Ambulatory Visit | Attending: Orthopedic Surgery

## 2011-10-19 ENCOUNTER — Ambulatory Visit (HOSPITAL_COMMUNITY): Payer: Managed Care, Other (non HMO) | Admitting: Anesthesiology

## 2011-10-19 ENCOUNTER — Encounter (HOSPITAL_COMMUNITY): Payer: Self-pay | Admitting: *Deleted

## 2011-10-19 DIAGNOSIS — J449 Chronic obstructive pulmonary disease, unspecified: Secondary | ICD-10-CM | POA: Insufficient documentation

## 2011-10-19 DIAGNOSIS — J4489 Other specified chronic obstructive pulmonary disease: Secondary | ICD-10-CM | POA: Insufficient documentation

## 2011-10-19 DIAGNOSIS — G8929 Other chronic pain: Secondary | ICD-10-CM | POA: Insufficient documentation

## 2011-10-19 DIAGNOSIS — G894 Chronic pain syndrome: Secondary | ICD-10-CM

## 2011-10-19 DIAGNOSIS — G9050 Complex regional pain syndrome I, unspecified: Secondary | ICD-10-CM | POA: Insufficient documentation

## 2011-10-19 DIAGNOSIS — Z01812 Encounter for preprocedural laboratory examination: Secondary | ICD-10-CM | POA: Insufficient documentation

## 2011-10-19 DIAGNOSIS — F17200 Nicotine dependence, unspecified, uncomplicated: Secondary | ICD-10-CM | POA: Insufficient documentation

## 2011-10-19 HISTORY — PX: SPINAL CORD STIMULATOR INSERTION: SHX5378

## 2011-10-19 SURGERY — INSERTION, SPINAL CORD STIMULATOR, LUMBAR
Anesthesia: General | Site: Spine Thoracic | Wound class: Clean

## 2011-10-19 MED ORDER — MINERAL OIL LIGHT 100 % EX OIL
TOPICAL_OIL | CUTANEOUS | Status: AC
  Filled 2011-10-19: qty 25

## 2011-10-19 MED ORDER — MORPHINE SULFATE 2 MG/ML IJ SOLN
1.0000 mg | INTRAMUSCULAR | Status: DC | PRN
  Administered 2011-10-19 (×2): 2 mg via INTRAVENOUS
  Filled 2011-10-19: qty 1

## 2011-10-19 MED ORDER — MIDAZOLAM HCL 5 MG/5ML IJ SOLN
INTRAMUSCULAR | Status: DC | PRN
  Administered 2011-10-19: 2 mg via INTRAVENOUS

## 2011-10-19 MED ORDER — DIAZEPAM 5 MG/ML IJ SOLN
INTRAMUSCULAR | Status: AC
  Filled 2011-10-19: qty 2

## 2011-10-19 MED ORDER — NALOXONE HCL 0.4 MG/ML IJ SOLN: 0.4000 mg | INTRAMUSCULAR | Status: DC | PRN

## 2011-10-19 MED ORDER — HYDROMORPHONE 0.3 MG/ML IV SOLN
INTRAVENOUS | Status: DC
  Administered 2011-10-19: 13:00:00 via INTRAVENOUS

## 2011-10-19 MED ORDER — DEXAMETHASONE 4 MG PO TABS
4.0000 mg | ORAL_TABLET | Freq: Four times a day (QID) | ORAL | Status: DC
  Administered 2011-10-20: 4 mg via ORAL
  Filled 2011-10-19 (×8): qty 1

## 2011-10-19 MED ORDER — HYDROMORPHONE HCL PF 1 MG/ML IJ SOLN
0.2500 mg | INTRAMUSCULAR | Status: DC | PRN
  Administered 2011-10-19 (×4): 0.5 mg via INTRAVENOUS

## 2011-10-19 MED ORDER — BUPIVACAINE-EPINEPHRINE PF 0.25-1:200000 % IJ SOLN
INTRAMUSCULAR | Status: AC
  Filled 2011-10-19: qty 30

## 2011-10-19 MED ORDER — ONDANSETRON HCL 4 MG/2ML IJ SOLN: 4.0000 mg | Freq: Four times a day (QID) | INTRAMUSCULAR | Status: DC | PRN

## 2011-10-19 MED ORDER — DIPHENHYDRAMINE HCL 50 MG/ML IJ SOLN: 12.5000 mg | Freq: Four times a day (QID) | INTRAMUSCULAR | Status: DC | PRN

## 2011-10-19 MED ORDER — ONDANSETRON HCL 4 MG/2ML IJ SOLN
INTRAMUSCULAR | Status: DC | PRN
  Administered 2011-10-19: 4 mg via INTRAVENOUS

## 2011-10-19 MED ORDER — LACTATED RINGERS IV SOLN
INTRAVENOUS | Status: DC
  Administered 2011-10-19 – 2011-10-20 (×2): via INTRAVENOUS

## 2011-10-19 MED ORDER — MORPHINE SULFATE 15 MG PO TABS
30.0000 mg | ORAL_TABLET | Freq: Three times a day (TID) | ORAL | Status: DC | PRN
  Administered 2011-10-20: 30 mg via ORAL
  Filled 2011-10-19: qty 2

## 2011-10-19 MED ORDER — HYDROMORPHONE 0.3 MG/ML IV SOLN
INTRAVENOUS | Status: AC
  Administered 2011-10-19: 2.89 mg via INTRAVENOUS
  Administered 2011-10-19: 20:00:00 via INTRAVENOUS
  Administered 2011-10-20: 8.39 mg via INTRAVENOUS
  Administered 2011-10-20: 02:00:00 via INTRAVENOUS
  Filled 2011-10-19 (×2): qty 25

## 2011-10-19 MED ORDER — CEFAZOLIN SODIUM 1-5 GM-% IV SOLN
1.0000 g | Freq: Three times a day (TID) | INTRAVENOUS | Status: AC
  Administered 2011-10-19 (×2): 1 g via INTRAVENOUS
  Filled 2011-10-19 (×2): qty 50

## 2011-10-19 MED ORDER — SODIUM CHLORIDE 0.9 % IJ SOLN: 3.0000 mL | INTRAMUSCULAR | Status: DC | PRN

## 2011-10-19 MED ORDER — ACETAMINOPHEN 10 MG/ML IV SOLN
1000.0000 mg | Freq: Four times a day (QID) | INTRAVENOUS | Status: DC
  Administered 2011-10-19 – 2011-10-20 (×3): 1000 mg via INTRAVENOUS
  Filled 2011-10-19 (×4): qty 100

## 2011-10-19 MED ORDER — OXYCODONE HCL 5 MG PO TABS
ORAL_TABLET | ORAL | Status: AC
  Filled 2011-10-19: qty 2

## 2011-10-19 MED ORDER — LIDOCAINE HCL (CARDIAC) 20 MG/ML IV SOLN
INTRAVENOUS | Status: DC | PRN
  Administered 2011-10-19: 100 mg via INTRAVENOUS

## 2011-10-19 MED ORDER — VECURONIUM BROMIDE 10 MG IV SOLR
INTRAVENOUS | Status: DC | PRN
  Administered 2011-10-19: 8 mg via INTRAVENOUS
  Administered 2011-10-19: 2 mg via INTRAVENOUS

## 2011-10-19 MED ORDER — SODIUM CHLORIDE 0.9 % IJ SOLN: 9.0000 mL | INTRAMUSCULAR | Status: DC | PRN

## 2011-10-19 MED ORDER — MORPHINE SULFATE 2 MG/ML IJ SOLN
INTRAMUSCULAR | Status: AC
  Filled 2011-10-19: qty 1

## 2011-10-19 MED ORDER — DIPHENHYDRAMINE HCL 12.5 MG/5ML PO ELIX: 12.5000 mg | ORAL_SOLUTION | Freq: Four times a day (QID) | ORAL | Status: DC | PRN

## 2011-10-19 MED ORDER — HYDROMORPHONE 0.3 MG/ML IV SOLN
INTRAVENOUS | Status: DC
  Administered 2011-10-19: 1.39 mg via INTRAVENOUS

## 2011-10-19 MED ORDER — DIAZEPAM 5 MG/ML IJ SOLN
5.0000 mg | Freq: Once | INTRAMUSCULAR | Status: AC
  Administered 2011-10-19: 5 mg via INTRAVENOUS

## 2011-10-19 MED ORDER — MIDAZOLAM HCL 2 MG/2ML IJ SOLN: 1.0000 mg | INTRAMUSCULAR | Status: DC | PRN

## 2011-10-19 MED ORDER — FENTANYL CITRATE 0.05 MG/ML IJ SOLN
INTRAMUSCULAR | Status: DC | PRN
  Administered 2011-10-19: 100 ug via INTRAVENOUS
  Administered 2011-10-19: 50 ug via INTRAVENOUS
  Administered 2011-10-19: 100 ug via INTRAVENOUS

## 2011-10-19 MED ORDER — FLUMAZENIL 0.5 MG/5ML IV SOLN
INTRAVENOUS | Status: AC
  Filled 2011-10-19: qty 5

## 2011-10-19 MED ORDER — HYDROMORPHONE 0.3 MG/ML IV SOLN: INTRAVENOUS | Status: DC

## 2011-10-19 MED ORDER — PROMETHAZINE HCL 25 MG/ML IJ SOLN
INTRAMUSCULAR | Status: AC
  Filled 2011-10-19: qty 1

## 2011-10-19 MED ORDER — GLYCOPYRROLATE 0.2 MG/ML IJ SOLN
INTRAMUSCULAR | Status: DC | PRN
  Administered 2011-10-19: .8 mg via INTRAVENOUS

## 2011-10-19 MED ORDER — ACETAMINOPHEN 10 MG/ML IV SOLN
INTRAVENOUS | Status: AC
  Filled 2011-10-19: qty 100

## 2011-10-19 MED ORDER — SODIUM CHLORIDE 0.9 % IJ SOLN
3.0000 mL | Freq: Two times a day (BID) | INTRAMUSCULAR | Status: DC
  Administered 2011-10-19: 3 mL via INTRAVENOUS

## 2011-10-19 MED ORDER — ACETAMINOPHEN 10 MG/ML IV SOLN
1000.0000 mg | Freq: Once | INTRAVENOUS | Status: AC
  Administered 2011-10-19: 1000 mg via INTRAVENOUS
  Filled 2011-10-19: qty 100

## 2011-10-19 MED ORDER — MENTHOL 3 MG MT LOZG: 1.0000 | LOZENGE | OROMUCOSAL | Status: DC | PRN

## 2011-10-19 MED ORDER — OXYCODONE HCL 5 MG PO TABS
10.0000 mg | ORAL_TABLET | ORAL | Status: DC | PRN
  Administered 2011-10-19: 10 mg via ORAL

## 2011-10-19 MED ORDER — OXYCODONE HCL 5 MG PO TABS
10.0000 mg | ORAL_TABLET | ORAL | Status: DC | PRN
  Administered 2011-10-20: 10 mg via ORAL
  Filled 2011-10-19: qty 2

## 2011-10-19 MED ORDER — DEXAMETHASONE SODIUM PHOSPHATE 10 MG/ML IJ SOLN
10.0000 mg | Freq: Once | INTRAMUSCULAR | Status: AC
  Administered 2011-10-19: 10 mg via INTRAVENOUS
  Filled 2011-10-19: qty 1

## 2011-10-19 MED ORDER — LACTATED RINGERS IV SOLN: INTRAVENOUS | Status: DC

## 2011-10-19 MED ORDER — ONDANSETRON HCL 4 MG/2ML IJ SOLN: 4.0000 mg | INTRAMUSCULAR | Status: DC | PRN

## 2011-10-19 MED ORDER — NEOSTIGMINE METHYLSULFATE 1 MG/ML IJ SOLN
INTRAMUSCULAR | Status: DC | PRN
  Administered 2011-10-19: 4 mg via INTRAVENOUS

## 2011-10-19 MED ORDER — PHENOL 1.4 % MT LIQD: 1.0000 | OROMUCOSAL | Status: DC | PRN

## 2011-10-19 MED ORDER — EPHEDRINE SULFATE 50 MG/ML IJ SOLN
INTRAMUSCULAR | Status: DC | PRN
  Administered 2011-10-19: 10 mg via INTRAVENOUS

## 2011-10-19 MED ORDER — THROMBIN 20000 UNITS EX SOLR
CUTANEOUS | Status: AC
  Filled 2011-10-19: qty 20000

## 2011-10-19 MED ORDER — METHOCARBAMOL 500 MG PO TABS
500.0000 mg | ORAL_TABLET | Freq: Four times a day (QID) | ORAL | Status: DC | PRN
  Administered 2011-10-19 – 2011-10-20 (×2): 500 mg via ORAL
  Filled 2011-10-19 (×2): qty 1

## 2011-10-19 MED ORDER — DOCUSATE SODIUM 100 MG PO CAPS
100.0000 mg | ORAL_CAPSULE | Freq: Two times a day (BID) | ORAL | Status: DC
  Administered 2011-10-19: 100 mg via ORAL
  Filled 2011-10-19 (×3): qty 1

## 2011-10-19 MED ORDER — METHOCARBAMOL 100 MG/ML IJ SOLN
500.0000 mg | Freq: Four times a day (QID) | INTRAVENOUS | Status: DC | PRN
  Administered 2011-10-19 (×2): 500 mg via INTRAVENOUS
  Filled 2011-10-19 (×2): qty 5

## 2011-10-19 MED ORDER — SODIUM CHLORIDE 0.9 % IV SOLN: 250.0000 mL | INTRAVENOUS | Status: DC

## 2011-10-19 MED ORDER — ZOLPIDEM TARTRATE 5 MG PO TABS
5.0000 mg | ORAL_TABLET | Freq: Every evening | ORAL | Status: DC | PRN
  Filled 2011-10-19: qty 1

## 2011-10-19 MED ORDER — 0.9 % SODIUM CHLORIDE (POUR BTL) OPTIME
TOPICAL | Status: DC | PRN
  Administered 2011-10-19: 1000 mL

## 2011-10-19 MED ORDER — HYDROMORPHONE 0.3 MG/ML IV SOLN
INTRAVENOUS | Status: AC
  Filled 2011-10-19: qty 25

## 2011-10-19 MED ORDER — PROMETHAZINE HCL 25 MG/ML IJ SOLN
6.2500 mg | INTRAMUSCULAR | Status: DC | PRN
  Administered 2011-10-19: 12.5 mg via INTRAVENOUS

## 2011-10-19 MED ORDER — DEXAMETHASONE SODIUM PHOSPHATE 4 MG/ML IJ SOLN
4.0000 mg | Freq: Four times a day (QID) | INTRAMUSCULAR | Status: DC
  Administered 2011-10-19 (×3): 4 mg via INTRAVENOUS
  Filled 2011-10-19 (×8): qty 1

## 2011-10-19 MED ORDER — PROPOFOL 10 MG/ML IV BOLUS
INTRAVENOUS | Status: DC | PRN
  Administered 2011-10-19: 200 mg via INTRAVENOUS

## 2011-10-19 MED ORDER — BUPIVACAINE-EPINEPHRINE PF 0.25-1:200000 % IJ SOLN
INTRAMUSCULAR | Status: DC | PRN
  Administered 2011-10-19: 3 mL

## 2011-10-19 MED ORDER — LACTATED RINGERS IV SOLN
INTRAVENOUS | Status: DC | PRN
  Administered 2011-10-19 (×2): via INTRAVENOUS

## 2011-10-19 SURGICAL SUPPLY — 63 items
BAG ISL DRAPE 18X18 STRL (DRAPES) ×1
BAG ISOLATION DRAPE 18X18 (DRAPES) ×1 IMPLANT
CANISTER SUCTION 2500CC (MISCELLANEOUS) ×2 IMPLANT
CHANNEL EON MINI 16 IPG (Orthopedic Implant) ×1 IMPLANT
CLOTH BEACON ORANGE TIMEOUT ST (SAFETY) ×2 IMPLANT
CLSR STERI-STRIP ANTIMIC 1/2X4 (GAUZE/BANDAGES/DRESSINGS) ×2 IMPLANT
CORDS BIPOLAR (ELECTRODE) ×2 IMPLANT
DRAPE C-ARM 42X72 X-RAY (DRAPES) ×2 IMPLANT
DRAPE INCISE IOBAN 85X60 (DRAPES) ×2 IMPLANT
DRAPE ISOLATION BAG 18X18 (DRAPES) ×1
DRAPE SURG 17X23 STRL (DRAPES) ×2 IMPLANT
DRAPE U-SHAPE 47X51 STRL (DRAPES) ×2 IMPLANT
DRSG MEPILEX BORDER 4X4 (GAUZE/BANDAGES/DRESSINGS) ×2 IMPLANT
DRSG MEPILEX BORDER 4X8 (GAUZE/BANDAGES/DRESSINGS) ×1 IMPLANT
DURAPREP 26ML APPLICATOR (WOUND CARE) ×2 IMPLANT
ELECT BLADE 4.0 EZ CLEAN MEGAD (MISCELLANEOUS) ×2
ELECT CAUTERY BLADE 6.4 (BLADE) ×2 IMPLANT
ELECT REM PT RETURN 9FT ADLT (ELECTROSURGICAL) ×2
ELECTRODE BLDE 4.0 EZ CLN MEGD (MISCELLANEOUS) IMPLANT
ELECTRODE REM PT RTRN 9FT ADLT (ELECTROSURGICAL) ×1 IMPLANT
GLOVE BIOGEL PI IND STRL 6.5 (GLOVE) ×1 IMPLANT
GLOVE BIOGEL PI IND STRL 8.5 (GLOVE) ×1 IMPLANT
GLOVE BIOGEL PI INDICATOR 6.5 (GLOVE) ×1
GLOVE BIOGEL PI INDICATOR 8.5 (GLOVE) ×1
GLOVE ECLIPSE 6.0 STRL STRAW (GLOVE) ×2 IMPLANT
GLOVE ECLIPSE 8.5 STRL (GLOVE) ×4 IMPLANT
GOWN EXTRA PROTECTION XL (GOWNS) ×3 IMPLANT
GOWN PREVENTION PLUS XXLARGE (GOWN DISPOSABLE) ×2 IMPLANT
GOWN STRL NON-REIN LRG LVL3 (GOWN DISPOSABLE) ×2 IMPLANT
KIT BASIN OR (CUSTOM PROCEDURE TRAY) ×2 IMPLANT
KIT ROOM TURNOVER OR (KITS) ×2 IMPLANT
LAMI NARROW PRIPOLE 16CH (Orthopedic Implant) ×1 IMPLANT
NDL SPNL 18GX3.5 QUINCKE PK (NEEDLE) ×3 IMPLANT
NDL SUT 6 .5 CRC .975X.05 MAYO (NEEDLE) ×1 IMPLANT
NEEDLE 22X1 1/2 (OR ONLY) (NEEDLE) ×2 IMPLANT
NEEDLE MAYO TAPER (NEEDLE) ×2
NEEDLE SPNL 18GX3.5 QUINCKE PK (NEEDLE) ×6 IMPLANT
NS IRRIG 1000ML POUR BTL (IV SOLUTION) ×2 IMPLANT
PACK LAMINECTOMY ORTHO (CUSTOM PROCEDURE TRAY) ×2 IMPLANT
PACK UNIVERSAL I (CUSTOM PROCEDURE TRAY) ×2 IMPLANT
PAD ARMBOARD 7.5X6 YLW CONV (MISCELLANEOUS) ×4 IMPLANT
PROGRAMMER EON PATIENT (MISCELLANEOUS) ×1 IMPLANT
SILICONE BRAIN SPATULA 10MM ×1 IMPLANT
SPONGE LAP 4X18 X RAY DECT (DISPOSABLE) ×2 IMPLANT
SPONGE SURGIFOAM ABS GEL 100 (HEMOSTASIS) ×2 IMPLANT
STAPLER VISISTAT 35W (STAPLE) ×3 IMPLANT
STRIP CLOSURE SKIN 1/2X4 (GAUZE/BANDAGES/DRESSINGS) ×2 IMPLANT
SURGIFLO TRUKIT (HEMOSTASIS) IMPLANT
SUT FIBERWIRE #2 38 REV NDL BL (SUTURE) ×2
SUT MNCRL AB 3-0 PS2 18 (SUTURE) ×4 IMPLANT
SUT VIC AB 1 CT1 27 (SUTURE) ×8
SUT VIC AB 1 CT1 27XBRD ANBCTR (SUTURE) ×3 IMPLANT
SUT VIC AB 2-0 CT1 18 (SUTURE) ×3 IMPLANT
SUT VIC AB 2-0 CT2 18 VCP726D (SUTURE) IMPLANT
SUTURE FIBERWR#2 38 REV NDL BL (SUTURE) ×1 IMPLANT
SYR BULB IRRIGATION 50ML (SYRINGE) ×2 IMPLANT
SYR CONTROL 10ML LL (SYRINGE) ×2 IMPLANT
SYSTEM CHARGING EON MINI LE (MISCELLANEOUS) ×1 IMPLANT
TOWEL OR 17X24 6PK STRL BLUE (TOWEL DISPOSABLE) ×2 IMPLANT
TOWEL OR 17X26 10 PK STRL BLUE (TOWEL DISPOSABLE) ×2 IMPLANT
TRAY FOLEY CATH 14FR (SET/KITS/TRAYS/PACK) IMPLANT
TUBE CONNECTING 12X1/4 (SUCTIONS) ×1 IMPLANT
WATER STERILE IRR 1000ML POUR (IV SOLUTION) ×1 IMPLANT

## 2011-10-19 NOTE — Addendum Note (Signed)
Addendum  created 10/19/11 1131 by Carmela Rima, CRNA   Modules edited:Anesthesia Medication Administration

## 2011-10-19 NOTE — H&P (Signed)
NO CHANGE IN CLINICAL EXAM H+P REVIEWED 

## 2011-10-19 NOTE — Addendum Note (Signed)
Addendum  created 10/19/11 1131 by Rudolf Blizard F Jasiri Hanawalt, CRNA   Modules edited:Anesthesia Medication Administration    

## 2011-10-19 NOTE — Preoperative (Signed)
Beta Blockers   Reason not to administer Beta Blockers:Not Applicable 

## 2011-10-19 NOTE — Anesthesia Procedure Notes (Signed)
Procedure Name: Intubation Date/Time: 10/19/2011 7:44 AM Performed by: Carmela Rima Pre-anesthesia Checklist: Patient identified, Emergency Drugs available, Timeout performed, Suction available and Patient being monitored Patient Re-evaluated:Patient Re-evaluated prior to inductionOxygen Delivery Method: Circle system utilized Preoxygenation: Pre-oxygenation with 100% oxygen Intubation Type: IV induction Ventilation: Mask ventilation without difficulty Laryngoscope Size: Mac and 3 Grade View: Grade I Tube type: Oral Tube size: 7.5 mm Number of attempts: 1 Placement Confirmation: ETT inserted through vocal cords under direct vision,  breath sounds checked- equal and bilateral and positive ETCO2 Secured at: 23 cm Tube secured with: Tape Dental Injury: Teeth and Oropharynx as per pre-operative assessment

## 2011-10-19 NOTE — Brief Op Note (Signed)
10/19/2011  9:43 AM  PATIENT:  Karl Jacobs  46 y.o. male  PRE-OPERATIVE DIAGNOSIS:  CHRONIC PAIN  POST-OPERATIVE DIAGNOSIS:  CHRONIC PAIN  PROCEDURE:  Procedure(s) (LRB) with comments: LUMBAR SPINAL CORD STIMULATOR INSERTION (N/A) - SPINAL CORD STIMULATOR PLACEMENT  SURGEON:  Surgeon(s) and Role:    * Venita Lick, MD - Primary  PHYSICIAN ASSISTANT:   ASSISTANTS: Norval Gable   ANESTHESIA:   general  EBL:  Total I/O In: 1000 [I.V.:1000] Out: -   BLOOD ADMINISTERED:none  DRAINS: none   LOCAL MEDICATIONS USED:  MARCAINE     SPECIMEN:  No Specimen  DISPOSITION OF SPECIMEN:  N/A  COUNTS:  YES  TOURNIQUET:  * No tourniquets in log *  DICTATION: .Other Dictation: Dictation Number [id number]  PLAN OF CARE: Admit for overnight observation  PATIENT DISPOSITION:  PACU - hemodynamically stable.

## 2011-10-19 NOTE — Transfer of Care (Signed)
Immediate Anesthesia Transfer of Care Note  Patient: Karl Jacobs  Procedure(s) Performed: Procedure(s) (LRB) with comments: LUMBAR SPINAL CORD STIMULATOR INSERTION (N/A) - SPINAL CORD STIMULATOR PLACEMENT  Patient Location: PACU  Anesthesia Type: General  Level of Consciousness: awake, alert  and oriented  Airway & Oxygen Therapy: Patient Spontanous Breathing and Patient connected to nasal cannula oxygen  Post-op Assessment: Report given to PACU RN, Post -op Vital signs reviewed and stable and Patient moving all extremities X 4  Post vital signs: Reviewed and stable  Complications: No apparent anesthesia complications

## 2011-10-19 NOTE — Anesthesia Postprocedure Evaluation (Signed)
  Anesthesia Post-op Note  Patient: Karl Jacobs  Procedure(s) Performed: Procedure(s) (LRB) with comments: LUMBAR SPINAL CORD STIMULATOR INSERTION (N/A) - SPINAL CORD STIMULATOR PLACEMENT  Patient Location: PACU  Anesthesia Type: General  Level of Consciousness: awake and alert   Airway and Oxygen Therapy: Patient Spontanous Breathing  Post-op Pain: mild  Post-op Assessment: Post-op Vital signs reviewed  Post-op Vital Signs: stable  Complications: No apparent anesthesia complications

## 2011-10-19 NOTE — Anesthesia Preprocedure Evaluation (Addendum)
Anesthesia Evaluation  Patient identified by MRN, date of birth, ID band Patient awake    Reviewed: Allergy & Precautions, H&P , NPO status , Patient's Chart, lab work & pertinent test results  Airway Mallampati: I TM Distance: >3 FB Neck ROM: Full    Dental  (+) Dental Advidsory Given and Teeth Intact   Pulmonary COPDCurrent Smoker,  + rhonchi         Cardiovascular Rhythm:Regular Rate:Normal     Neuro/Psych    GI/Hepatic   Endo/Other    Renal/GU      Musculoskeletal   Abdominal   Peds  Hematology   Anesthesia Other Findings   Reproductive/Obstetrics                          Anesthesia Physical Anesthesia Plan  ASA: II  Anesthesia Plan: General   Post-op Pain Management:    Induction: Intravenous  Airway Management Planned: Oral ETT  Additional Equipment:   Intra-op Plan:   Post-operative Plan: Extubation in OR  Informed Consent: I have reviewed the patients History and Physical, chart, labs and discussed the procedure including the risks, benefits and alternatives for the proposed anesthesia with the patient or authorized representative who has indicated his/her understanding and acceptance.   Dental Advisory Given  Plan Discussed with: CRNA, Surgeon and Anesthesiologist  Anesthesia Plan Comments:        Anesthesia Quick Evaluation

## 2011-10-20 MED ORDER — OXYCODONE-ACETAMINOPHEN 10-325 MG PO TABS: 1.0000 | ORAL_TABLET | ORAL | Status: AC | PRN

## 2011-10-20 MED ORDER — METHOCARBAMOL 500 MG PO TABS: 500.0000 mg | ORAL_TABLET | Freq: Three times a day (TID) | ORAL | Status: AC

## 2011-10-20 NOTE — Progress Notes (Signed)
    Subjective: Procedure(s) (LRB): LUMBAR SPINAL CORD STIMULATOR INSERTION (N/A) 1 Day Post-Op  Patient reports pain as 4 on 0-10 scale.  Reports decreased leg pain reports incisional back pain   Positive void Negative bowel movement Positive flatus Negative chest pain or shortness of breath  Objective: Vital signs in last 24 hours: Temp:  [97.9 F (36.6 C)-98.3 F (36.8 C)] 97.9 F (36.6 C) (09/13 0529) Pulse Rate:  [52-74] 59  (09/13 0529) Resp:  [14-23] 18  (09/13 0529) BP: (93-138)/(47-91) 108/59 mmHg (09/13 0529) SpO2:  [96 %-100 %] 97 % (09/13 0529)  Intake/Output from previous day: 09/12 0701 - 09/13 0700 In: 3614.2 [P.O.:240; I.V.:2974.2; IV Piggyback:400] Out: 900 [Urine:800; Blood:100]  No results found for this basename: WBC:2,RBC:2,HCT:2,PLT:2 in the last 72 hours No results found for this basename: NA:2,K:2,CL:2,CO2:2,BUN:2,CREATININE:2,GLUCOSE:2,CALCIUM:2 in the last 72 hours No results found for this basename: LABPT:2,INR:2 in the last 72 hours  Neurologically intact Neurovascular intact Incision: dressing C/D/I Compartment soft  Assessment/Plan: Patient stable  Continue mobilization Continue care  Discharge home after stimulator settings programmed and patient sees Dr. Hillery Jacks, [place] 10/20/2011, 7:31 AM

## 2011-10-20 NOTE — Op Note (Signed)
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  OPERATIVE PROCEDURE:  Spinal cord stimulator implantation.  COMPLICATIONS:  None.  CONDITION:  Stable.  FIRST ASSISTANT:  Norval Gable, PA  INTRAOPERATIVE FINDINGS:  T10 laminotomy performed for plantation of the electrode from [place]. Jude.  This was placed standing from the inferior aspect of T8 to the T9-10 disk space slightly to the right.  This corresponds with preoperative trial implantation positioning.  Battery was placed on the right side per the patient's request.  HISTORY:  This is a very pleasant gentleman who has been complaining of severe debilitating pain for some time now.  Despite previous lumbar spinal surgery and ongoing conservative management, he failed to alleviate his symptoms.  The patient had a trial spinal cord stimulator placement and had excellent results with it.  After passing the trial, he elected to proceed with the permanent implantation.  All appropriate risks, benefits, and alternatives were discussed with the patient and consent was obtained.  OPERATIVE NOTE:  The patient was brought into the operating room and placed supine on the operating table.  After successful induction of general anesthesia and endotracheal intubation, TEDs and SCDs were applied.  He was turned prone onto the [place] frame.  All bony prominences were well padded and the back was prepped and draped in a standard fashion.  Preoperatively, the incision sites were mapped out. Time-out was  done to confirm patient, procedure, and all other pertinent important data.  Once this was done using lateral fluoroscopy, I identified the L5 vertebral body.  I placed an 18-gauge needle near this and then began counting up until I reached the T10 vertebral body.  Once I confirmed in the AP and lateral planes that I was at the T10 vertebral body, an incision was made starting at the inferior aspect of T10 pedicle and proceeding down to the inferior aspect of the T11 pedicle. Sharp dissection was carried out in the midline down to the deep fascia. The deep fascia was sharply incised and I stripped the paraspinal muscles to expose the spinous processes of T10 and T11.  Once this was done, self-retaining retractor was placed and I checked a second time in the lateral plane to ensure I was at the appropriate level.  Once I checked again in the lateral plane counting up from the L5 vertebral body, I removed the majority of the T10 spinous process.  I then used a small microcurette to develop a plane underneath the T10 lamina and then used a 2-mm Kerrison to perform a laminotomy of T10.  I then used a [place] 4 to gently dissect in the central raphe of the ligamentum flavum to expose the underlying epidural fat.  Using a 2-mm Kerrison, I removed the lamina and  then gently dissected through the epidural fat until I could visualize the dorsal surface of the thecal sac.  Once this was done, I irrigated it copiously with normal saline and then used the dural spatula to develop a plane for permanent implantation.  The spatula went in without any difficulty.  I then obtained a St. Jude 3 x 16.  This lead was then advanced up without difficulty until the tip was just beyond the inferior border of the T8 vertebral body.  I also made sure to slightly deviate the implant to the right side because of his significant right leg pain.  Once this was done, AP and lateral x-rays confirmed satisfactory  positioning of the implant.  I then identified the T11-12 interspinous process ligament and incised it.  Using FiberWire, I passed the sutures through the T11 spinous process itself and secured the leads down to the T11 vertebral body directly.  I then wrapped it around the inferior aspect of the T11 vertebral body in the interspinous ligament.  I then re-sutured the ligament down.  At this point, I made a second incision at the battery site on the right gluteal.  I then dissected down a depth of 2.5 cm and then created a pocket.  Using the trocar, I passed the wires from the thoracic wound submuscular to the gluteal wound.  I then irrigated the wound out, obtained the implanted battery, secured the leads using the wrench, torqued it down appropriately.  I then implanted the battery and secured it to the deep fascia with two interrupted #1 Vicryl sutures.  We then tested the battery and both leads were functioning without difficulty and the battery itself was function without difficulty.  Both wounds were then copiously irrigated.  I used bipolar electrocautery to obtain hemostasis and then closed the deep fascia of the thoracic wound with interrupted #1 Vicryl sutures, then a layered 2-0 Vicryl sutures and a 3- 0 Monocryl.  The battery wound was closed with interrupted #1 Vicryl sutures, 2-0 Vicryl sutures, and a 3-0 Monocryl.  Steri-Strips and dry dressings were applied.  The patient was extubated and transferred to the PACU without incident.  At the end of the case, all needle and sponge counts were correct.  There was no adverse intraoperative events.     Alvy Beal, MD     DDB/MEDQ  D:  10/19/2011  T:  10/20/2011  Job:  [id number]

## 2011-10-20 NOTE — Progress Notes (Signed)
Discharge instructions and prescriptions reviewed with patient.  Pain medicine given prior to discharge.  Patient verbalized understanding on all instructions.  He was informed to call Dr. Shon Baton office should any issues arise prior to follow-up appointment.  Patient took all belongings with him.  Toriana Sponsel N

## 2011-10-20 NOTE — Discharge Summary (Signed)
Patient ID: Karl Jacobs MRN: [id number] DOB/AGE: [birth year] 46 y.o.  Admit date: 10/19/2011 Discharge date: 10/20/2011  Admission Diagnoses:  Principal Problem:  *Chronic pain syndrome   Discharge Diagnoses:  Principal Problem:  *Chronic pain syndrome  status post Procedure(s): LUMBAR SPINAL CORD STIMULATOR INSERTION  Past Medical History  Diagnosis Date  . Post traumatic stress disorder (PTSD)     H/O .Marland Kitchen...  "FELT BETTER IN 2006"  . Chronic back pain     Surgeries: Procedure(s): LUMBAR SPINAL CORD STIMULATOR INSERTION on 10/19/2011   Consultants: none  Discharged Condition: Improved  Hospital Course: Karl Jacobs is an 46 y.o. male who was admitted 10/19/2011 for operative treatment of Chronic pain syndrome. Patient failed conservative treatments (please see the history and physical for the specifics) and had severe unremitting pain that affects sleep, daily activities and work/hobbies. After pre-op clearance, the patient was taken to the operating room on 10/19/2011 and underwent  Procedure(s): LUMBAR SPINAL CORD STIMULATOR INSERTION.    Patient was given perioperative antibiotics: Anti-infectives     Start     Dose/Rate Route Frequency Ordered Stop   10/19/11 1600   ceFAZolin (ANCEF) IVPB 1 g/50 mL premix        1 g 100 mL/hr over 30 Minutes Intravenous Every 8 hours 10/19/11 1200 10/20/11 0028   10/18/11 1428   ceFAZolin (ANCEF) IVPB 2 g/50 mL premix        2 g 100 mL/hr over 30 Minutes Intravenous 60 min pre-op 10/18/11 1428 10/19/11 0751           Patient was given sequential compression devices and early ambulation to prevent DVT.   Patient benefited maximally from hospital stay and there were no complications. At the time of discharge, the patient was urinating/moving their bowels without difficulty, tolerating a regular diet, pain is controlled with oral pain medications and they have been cleared by PT/OT.   Recent vital signs: Patient Vitals  for the past 24 hrs:  BP Temp Pulse Resp SpO2  10/20/11 0529 108/59 mmHg 97.9 F (36.6 C) 59  18  97 %  10/20/11 0200 97/47 mmHg 98.3 F (36.8 C) 52  16  96 %  10/19/11 2125 97/47 mmHg 98.3 F (36.8 C) 60  18  97 %  10/19/11 1939 - - - 18  99 %  10/19/11 1710 106/62 mmHg 98.1 F (36.7 C) 62  18  99 %  10/19/11 1600 - - - 16  99 %  10/19/11 1400 99/59 mmHg 98 F (36.7 C) 58  18  99 %  10/19/11 1330 - - - 18  99 %  10/19/11 1201 107/60 mmHg 98 F (36.7 C) 72  20  99 %  10/19/11 1130 - - 74  16  99 %  10/19/11 1115 96/55 mmHg - 63  14  99 %  10/19/11 1100 96/55 mmHg 98 F (36.7 C) 63  16  99 %  10/19/11 1045 93/50 mmHg - 62  16  99 %  10/19/11 1030 117/59 mmHg - 62  20  -  10/19/11 1015 106/57 mmHg - 63  18  99 %  10/19/11 1000 138/91 mmHg 98 F (36.7 C) 72  23  100 %     Recent laboratory studies: No results found for this basename: WBC:2,HGB:2,HCT:2,PLT:2,NA:2,K:2,CL:2,CO2:2,BUN:2,CREATININE:2,GLUCOSE:2,PT:2,INR:2,CALCIUM,2: in the last 72 hours   Discharge Medications:     Medication List     As of 10/20/2011  7:26 AM    TAKE  these medications         methocarbamol 500 MG tablet   Commonly known as: ROBAXIN   Take 1 tablet (500 mg total) by mouth 3 (three) times daily. MAX 3 pills daily      morphine 30 MG tablet   Commonly known as: MSIR   Take 30 mg by mouth 3 (three) times daily.      ondansetron 8 MG disintegrating tablet   Commonly known as: ZOFRAN-ODT   Take 1 tablet (8 mg total) by mouth every 12 (twelve) hours as needed for nausea.      oxyCODONE-acetaminophen 10-325 MG per tablet   Commonly known as: PERCOCET   Take 1 tablet by mouth every 4 (four) hours as needed for pain. MAX 4 pills daily      traZODone 50 MG tablet   Commonly known as: DESYREL   Take 50 mg by mouth at bedtime.        Diagnostic Studies: Dg Lumbar Spine 2-3 Views  10/19/2011  *RADIOLOGY REPORT*  Clinical data:  Spinal cord stimulator insertion  LUMBAR SPINE TWO-VIEW   Comparison: None available  Findings:  Two fluoroscopic spot images from intraoperative C-arm fluoroscopy document placement of a dorsal stimulator catheter in the lower thoracic spine.  There is incomplete anatomic information to conclusively determine the placement level.  IMPRESSION: 1.  Dorsal stimulator catheter placement.   Original Report Authenticated By: Osa Craver, M.D.    Dg C-arm 1-60 Min  10/19/2011  *RADIOLOGY REPORT*  Clinical data:  Spinal cord stimulator insertion  LUMBAR SPINE TWO-VIEW  Comparison: None available  Findings:  Two fluoroscopic spot images from intraoperative C-arm fluoroscopy document placement of a dorsal stimulator catheter in the lower thoracic spine.  There is incomplete anatomic information to conclusively determine the placement level.  IMPRESSION: 1.  Dorsal stimulator catheter placement.   Original Report Authenticated By: Osa Craver, M.D.         Discharge Orders    Future Orders Please Complete By Expires   Diet - low sodium heart healthy      Call MD / Call 911      Comments:   If you experience chest pain or shortness of breath, CALL 911 and be transported to the hospital emergency room.  If you develope a fever above 101 F, pus (white drainage) or increased drainage or redness at the wound, or calf pain, call your surgeon's office.   Constipation Prevention      Comments:   Drink plenty of fluids.  Prune juice may be helpful.  You may use a stool softener, such as Colace (over the counter) 100 mg twice a day.  Use MiraLax (over the counter) for constipation as needed.   Increase activity slowly as tolerated      Discharge instructions      Comments:   Keep incision clean and dry.  Leave steri strips in place.  May shower 5 days from surgery; pat to dry following shower.  May redress with clean, dry dressing if you would like.  Do not apply any lotion/cream/ointment to the incision.   Driving restrictions      Comments:   No  driving for 2 weeks.  Dr Shon Baton will discuss addition driving restrictions at your first post-op visit in 2 weeks.   Lifting restrictions      Comments:   No lifting anything greater than 5 pounds.  DO NOT reach overhead (above shoulder height).  NO bending, stooping  or squatting.  Dr. Shon Baton will discuss additional lifting restrictions at your first post-op visit in 2 weeks.      Follow-up Information    Follow up with Alvy Beal, MD. In 2 weeks.   Contact information:   [facility] [address] [place] [address] [phone]          Discharge Plan:  discharge to home   Disposition: stable at the time of DC     Signed: Gwinda Maine for Dr. Venita Lick [facility] [phone] 10/20/2011, 7:26 AM

## 2011-10-20 NOTE — Progress Notes (Signed)
Patient referred per MD for SNF; referral received this a.m.  Patient has been d/c'd home and has left the hospital this am prior to CSW being able to review and assess. No CSW needs per nursing.  CSW signing up.  Lorri Frederick. [place]  [phone]

## 2011-10-23 ENCOUNTER — Encounter (HOSPITAL_COMMUNITY): Payer: Self-pay | Admitting: Orthopedic Surgery

## 2012-08-20 ENCOUNTER — Other Ambulatory Visit: Payer: Self-pay | Admitting: Orthopedic Surgery

## 2012-08-20 DIAGNOSIS — M549 Dorsalgia, unspecified: Secondary | ICD-10-CM

## 2012-08-27 ENCOUNTER — Ambulatory Visit
Admission: RE | Admit: 2012-08-27 | Discharge: 2012-08-27 | Disposition: A | Discharge status: Home / Self Care | Payer: Managed Care, Other (non HMO) | Source: Ambulatory Visit | Attending: Orthopedic Surgery | Admitting: Orthopedic Surgery

## 2012-08-27 ENCOUNTER — Other Ambulatory Visit: Payer: Self-pay | Admitting: Orthopedic Surgery

## 2012-08-27 VITALS — BP 106/61 | HR 81 | Resp 10

## 2012-08-27 DIAGNOSIS — M549 Dorsalgia, unspecified: Secondary | ICD-10-CM

## 2012-08-27 DIAGNOSIS — G894 Chronic pain syndrome: Secondary | ICD-10-CM

## 2012-08-27 MED ORDER — SODIUM CHLORIDE 0.9 % IV SOLN
Freq: Once | INTRAVENOUS | Status: AC
  Administered 2012-08-27: 08:00:00 via INTRAVENOUS

## 2012-08-27 MED ORDER — FENTANYL CITRATE 0.05 MG/ML IJ SOLN
25.0000 ug | INTRAMUSCULAR | Status: DC | PRN
  Administered 2012-08-27: 100 ug via INTRAVENOUS

## 2012-08-27 MED ORDER — KETOROLAC TROMETHAMINE 30 MG/ML IJ SOLN
30.0000 mg | Freq: Once | INTRAMUSCULAR | Status: AC
  Administered 2012-08-27: 30 mg via INTRAVENOUS

## 2012-08-27 MED ORDER — ONDANSETRON HCL 4 MG/2ML IJ SOLN
4.0000 mg | Freq: Once | INTRAMUSCULAR | Status: AC
  Administered 2012-08-27: 4 mg via INTRAMUSCULAR

## 2012-08-27 MED ORDER — HYDROMORPHONE HCL PF 2 MG/ML IJ SOLN
2.0000 mg | Freq: Once | INTRAMUSCULAR | Status: AC
  Administered 2012-08-27: 2 mg via INTRAMUSCULAR

## 2012-08-27 MED ORDER — CEFAZOLIN SODIUM-DEXTROSE 2-3 GM-% IV SOLR
2.0000 g | Freq: Once | INTRAVENOUS | Status: AC
  Administered 2012-08-27: 2 g via INTRAVENOUS

## 2012-08-27 MED ORDER — IOHEXOL 180 MG/ML  SOLN: 3.0000 mL | Freq: Once | INTRAMUSCULAR | Status: AC | PRN

## 2012-08-27 MED ORDER — MIDAZOLAM HCL 2 MG/2ML IJ SOLN
1.0000 mg | INTRAMUSCULAR | Status: DC | PRN
  Administered 2012-08-27: 2 mg via INTRAVENOUS
  Administered 2012-08-27: 1 mg via INTRAVENOUS

## 2012-08-27 NOTE — Progress Notes (Signed)
Cold pack to back for discomfort, pain meds given and wife to bedside. JKL RN

## 2012-08-29 ENCOUNTER — Other Ambulatory Visit: Payer: Managed Care, Other (non HMO)

## 2013-01-06 ENCOUNTER — Ambulatory Visit (INDEPENDENT_AMBULATORY_CARE_PROVIDER_SITE_OTHER): Payer: Managed Care, Other (non HMO) | Admitting: Physician Assistant

## 2013-01-06 ENCOUNTER — Encounter: Payer: Self-pay | Admitting: Physician Assistant

## 2013-01-06 VITALS — BP 104/74 | HR 76 | Temp 98.2°F | Resp 16 | Ht 72.0 in | Wt 198.5 lb

## 2013-01-06 DIAGNOSIS — Z7189 Other specified counseling: Secondary | ICD-10-CM

## 2013-01-06 DIAGNOSIS — G894 Chronic pain syndrome: Secondary | ICD-10-CM

## 2013-01-06 DIAGNOSIS — Z7689 Persons encountering health services in other specified circumstances: Secondary | ICD-10-CM

## 2013-01-06 DIAGNOSIS — F4310 Post-traumatic stress disorder, unspecified: Secondary | ICD-10-CM | POA: Insufficient documentation

## 2013-01-06 NOTE — Progress Notes (Signed)
Pre visit review using our clinic review tool, if applicable. No additional management support is needed unless otherwise documented below in the visit note/SLS  

## 2013-01-06 NOTE — Assessment & Plan Note (Signed)
Health Maintenance UTD.  Reviewed medications, allergies, PMH, social history and family history.  No refills needed at present time.  Offered CPE, but patient declines at today's visit.

## 2013-01-06 NOTE — Assessment & Plan Note (Signed)
Depression/Mood controlled with current medications.  Nightmares are helped with Prazosin.  Continue current care.

## 2013-01-06 NOTE — Assessment & Plan Note (Signed)
Continue current care.  Follow-up with Orthopedics and Neurology as scheduled.

## 2013-01-06 NOTE — Patient Instructions (Signed)
Please return for complete physical and fasting labs.  If you need anything in the mean time, please call or return to clinic.  It was a pleasure participating in your care today.

## 2013-01-06 NOTE — Progress Notes (Signed)
Patient ID: Karl Jacobs, male   DOB: Jan 10, 1966, 47 y.o.   MRN: [id number]  Patient presents to clinic today to establish care.  Does not want a complete physical at today's visit.  Chronic Issues: (1) PTSD/Depression -- endorses symptoms are well cotnrolled with Cymbalta and Trasodone.  Has Rx for Prazosin to help with nightmares.  Denies depressed mood or anhedonia.  Denies hallucinations or substance abuse.  Denies SI/HI.  (2) Chronic Pain Syndrome -- patient followed by Neurology, Orthopedics and Neurosurgery for chronic back pain.  Patient has had several back surgeries, including placement of a Spinal implant.  Is due for an infusion to help with pain in early 2015. 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  Health Maintenance: Dental -- UTD Vision -- overdue Immunizations -- UTD  Past Medical History  Diagnosis Date  . Post traumatic stress disorder (PTSD)     H/O .Marland Kitchen...  "FELT BETTER IN 2006"  . Chronic back pain   . Depression     Current Outpatient Prescriptions on File Prior to Visit  Medication Sig Dispense Refill  . traZODone (DESYREL) 50 MG tablet Take 50 mg by mouth at bedtime.       No current facility-administered medications on file prior to visit.    No Known Allergies  Family History  Problem Relation Age of Onset  . Other Mother     Padgett's; Living  . Hodgkin's lymphoma Father     Living  . Stroke Paternal Grandfather   . Healthy Brother     x1  . Healthy Sister     x2    History   Social History  . Marital Status: Married    Spouse Name: N/A    Number of Children: N/A  . Years of Education: N/A   Social History Main Topics  . Smoking status: Former Smoker -- 1.00 packs/day for 30 years    Types: Cigarettes    Quit date: 11/06/2012  . Smokeless tobacco: Never Used     Comment: Using Vapor Cigarette to quit  . Alcohol Use: No  . Drug Use: No  . Sexual Activity:  Yes    Birth Control/ Protection: None   Other Topics Concern  . None   Social History Narrative  . None   Review of Systems  Constitutional: Negative for fever, weight loss and malaise/fatigue.  HENT: Negative for ear discharge, ear pain, hearing loss and tinnitus.   Eyes: Negative for blurred vision, double vision and photophobia.  Respiratory: Negative for cough, shortness of breath and wheezing.   Cardiovascular: Negative for chest pain and palpitations.  Gastrointestinal: Negative for heartburn, nausea, vomiting, abdominal pain, diarrhea, constipation, blood in stool and melena.  Genitourinary: Negative for dysuria, urgency, frequency, hematuria and flank pain.  Musculoskeletal: Positive for back pain and joint pain.  Neurological: Negative for dizziness, seizures, loss of consciousness and headaches.  Endo/Heme/Allergies: Negative for environmental allergies.  Psychiatric/Behavioral: Positive for depression. Negative for suicidal ideas, hallucinations, memory loss and substance abuse. The patient is nervous/anxious and has insomnia.    Filed Vitals:   01/06/13 1440  BP: 104/74  Pulse: 76  Temp: 98.2 F (36.8 C)  Resp: 16   Physical Exam  Vitals reviewed. Constitutional: He is oriented to person, place, and time and well-developed, well-nourished, and in no distress.  HENT:  Head: Normocephalic and atraumatic.  Right Ear: External ear normal.  Left Ear: External ear  normal.  Nose: Nose normal.  Mouth/Throat: Oropharynx is clear and moist.  Eyes: Conjunctivae and EOM are normal. Pupils are equal, round, and reactive to light.  Neck: Neck supple.  Cardiovascular: Normal rate, regular rhythm and normal heart sounds.   Pulmonary/Chest: Breath sounds normal.  Abdominal: Soft. Bowel sounds are normal.  Lymphadenopathy:    He has no cervical adenopathy.  Neurological: He is alert and oriented to person, place, and time. No cranial nerve deficit.  Skin: Skin is warm and  dry. No rash noted.  Psychiatric: Affect normal.   Assessment/Plan: Chronic pain syndrome Continue current care.  Follow-up with Orthopedics and Neurology as scheduled.  PTSD (post-traumatic stress disorder) Depression/Mood controlled with current medications.  Nightmares are helped with Prazosin.  Continue current care.  Encounter to establish care Health Maintenance UTD.  Reviewed medications, allergies, PMH, social history and family history.  No refills needed at present time.  Offered CPE, but patient declines at today's visit.

## 2013-07-24 ENCOUNTER — Ambulatory Visit (INDEPENDENT_AMBULATORY_CARE_PROVIDER_SITE_OTHER): Payer: BC Managed Care – PPO | Admitting: Family Medicine

## 2013-07-24 ENCOUNTER — Encounter: Payer: Self-pay | Admitting: Family Medicine

## 2013-07-24 VITALS — BP 122/70 | HR 70 | Temp 98.2°F | Ht 72.0 in | Wt 187.1 lb

## 2013-07-24 DIAGNOSIS — F4310 Post-traumatic stress disorder, unspecified: Secondary | ICD-10-CM

## 2013-07-24 DIAGNOSIS — L919 Hypertrophic disorder of the skin, unspecified: Secondary | ICD-10-CM

## 2013-07-24 DIAGNOSIS — Z0000 Encounter for general adult medical examination without abnormal findings: Secondary | ICD-10-CM

## 2013-07-24 DIAGNOSIS — L909 Atrophic disorder of skin, unspecified: Secondary | ICD-10-CM

## 2013-07-24 DIAGNOSIS — L918 Other hypertrophic disorders of the skin: Secondary | ICD-10-CM

## 2013-07-24 DIAGNOSIS — Z7189 Other specified counseling: Secondary | ICD-10-CM

## 2013-07-24 DIAGNOSIS — Z7689 Persons encountering health services in other specified circumstances: Secondary | ICD-10-CM

## 2013-07-24 DIAGNOSIS — G894 Chronic pain syndrome: Secondary | ICD-10-CM

## 2013-07-24 DIAGNOSIS — Z800 Family history of malignant neoplasm of digestive organs: Secondary | ICD-10-CM

## 2013-07-24 NOTE — Patient Instructions (Signed)
Add PSA to next years labs    Preventive Care for Adults A healthy lifestyle and preventive care can promote health and wellness. Preventive health guidelines for men include the following key practices:  A routine yearly physical is a good way to check with your health care provider about your health and preventative screening. It is a chance to share any concerns and updates on your health and to receive a thorough exam.  Visit your dentist for a routine exam and preventative care every 6 months. Brush your teeth twice a day and floss once a day. Good oral hygiene prevents tooth decay and gum disease.  The frequency of eye exams is based on your age, health, family medical history, use of contact lenses, and other factors. Follow your health care provider's recommendations for frequency of eye exams.  Eat a healthy diet. Foods such as vegetables, fruits, whole grains, low-fat dairy products, and lean protein foods contain the nutrients you need without too many calories. Decrease your intake of foods high in solid fats, added sugars, and salt. Eat the right amount of calories for you.Get information about a proper diet from your health care provider, if necessary.  Regular physical exercise is one of the most important things you can do for your health. Most adults should get at least 150 minutes of moderate-intensity exercise (any activity that increases your heart rate and causes you to sweat) each week. In addition, most adults need muscle-strengthening exercises on 2 or more days a week.  Maintain a healthy weight. The body mass index (BMI) is a screening tool to identify possible weight problems. It provides an estimate of body fat based on height and weight. Your health care provider can find your BMI and can help you achieve or maintain a healthy weight.For adults 20 years and older:  A BMI below 18.5 is considered underweight.  A BMI of 18.5 to 24.9 is normal.  A BMI of 25 to 29.9  is considered overweight.  A BMI of 30 and above is considered obese.  Maintain normal blood lipids and cholesterol levels by exercising and minimizing your intake of saturated fat. Eat a balanced diet with plenty of fruit and vegetables. Blood tests for lipids and cholesterol should begin at age 48 and be repeated every 5 years. If your lipid or cholesterol levels are high, you are over 50, or you are at high risk for heart disease, you may need your cholesterol levels checked more frequently.Ongoing high lipid and cholesterol levels should be treated with medicines if diet and exercise are not working.  If you smoke, find out from your health care provider how to quit. If you do not use tobacco, do not start.  Lung cancer screening is recommended for adults aged 56-80 years who are at high risk for developing lung cancer because of a history of smoking. A yearly low-dose CT scan of the lungs is recommended for people who have at least a 30-pack-year history of smoking and are a current smoker or have quit within the past 15 years. A pack year of smoking is smoking an average of 1 pack of cigarettes a day for 1 year (for example: 1 pack a day for 30 years or 2 packs a day for 15 years). Yearly screening should continue until the smoker has stopped smoking for at least 15 years. Yearly screening should be stopped for people who develop a health problem that would prevent them from having lung cancer treatment.  If you  choose to drink alcohol, do not have more than 2 drinks per day. One drink is considered to be 12 ounces (355 mL) of beer, 5 ounces (148 mL) of wine, or 1.5 ounces (44 mL) of liquor.  Avoid use of street drugs. Do not share needles with anyone. Ask for help if you need support or instructions about stopping the use of drugs.  High blood pressure causes heart disease and increases the risk of stroke. Your blood pressure should be checked at least every 1-2 years. Ongoing high blood  pressure should be treated with medicines, if weight loss and exercise are not effective.  If you are 62-66 years old, ask your health care provider if you should take aspirin to prevent heart disease.  Diabetes screening involves taking a blood sample to check your fasting blood sugar level. This should be done once every 3 years, after age 20, if you are within normal weight and without risk factors for diabetes. Testing should be considered at a younger age or be carried out more frequently if you are overweight and have at least 1 risk factor for diabetes.  Colorectal cancer can be detected and often prevented. Most routine colorectal cancer screening begins at the age of 110 and continues through age 35. However, your health care provider may recommend screening at an earlier age if you have risk factors for colon cancer. On a yearly basis, your health care provider may provide home test kits to check for hidden blood in the stool. Use of a small camera at the end of a tube to directly examine the colon (sigmoidoscopy or colonoscopy) can detect the earliest forms of colorectal cancer. Talk to your health care provider about this at age 97, when routine screening begins. Direct exam of the colon should be repeated every 5-10 years through age 69, unless early forms of precancerous polyps or small growths are found.  People who are at an increased risk for hepatitis B should be screened for this virus. You are considered at high risk for hepatitis B if:  You were born in a country where hepatitis B occurs often. Talk with your health care provider about which countries are considered high risk.  Your parents were born in a high-risk country and you have not received a shot to protect against hepatitis B (hepatitis B vaccine).  You have HIV or AIDS.  You use needles to inject street drugs.  You live with, or have sex with, someone who has hepatitis B.  You are a man who has sex with other men  (MSM).  You get hemodialysis treatment.  You take certain medicines for conditions such as cancer, organ transplantation, and autoimmune conditions.  Hepatitis C blood testing is recommended for all people born from [birth year] through 1965 and any individual with known risks for hepatitis C.  Practice safe sex. Use condoms and avoid high-risk sexual practices to reduce the spread of sexually transmitted infections (STIs). STIs include gonorrhea, chlamydia, syphilis, trichomonas, herpes, HPV, and human immunodeficiency virus (HIV). Herpes, HIV, and HPV are viral illnesses that have no cure. They can result in disability, cancer, and death.  If you are at risk of being infected with HIV, it is recommended that you take a prescription medicine daily to prevent HIV infection. This is called preexposure prophylaxis (PrEP). You are considered at risk if:  You are a man who has sex with other men (MSM) and have other risk factors.  You are a heterosexual man, are sexually  active, and are at increased risk for HIV infection.  You take drugs by injection.  You are sexually active with a partner who has HIV.  Talk with your health care provider about whether you are at high risk of being infected with HIV. If you choose to begin PrEP, you should first be tested for HIV. You should then be tested every 3 months for as long as you are taking PrEP.  A one-time screening for abdominal aortic aneurysm (AAA) and surgical repair of large AAAs by ultrasound are recommended for men ages 59 to 81 years who are current or former smokers.  Healthy men should no longer receive prostate-specific antigen (PSA) blood tests as part of routine cancer screening. Talk with your health care provider about prostate cancer screening.  Testicular cancer screening is not recommended for adult males who have no symptoms. Screening includes self-exam, a health care provider exam, and other screening tests. Consult with your health  care provider about any symptoms you have or any concerns you have about testicular cancer.  Use sunscreen. Apply sunscreen liberally and repeatedly throughout the day. You should seek shade when your shadow is shorter than you. Protect yourself by wearing long sleeves, pants, a wide-brimmed hat, and sunglasses year round, whenever you are outdoors.  Once a month, do a whole-body skin exam, using a mirror to look at the skin on your back. Tell your health care provider about new moles, moles that have irregular borders, moles that are larger than a pencil eraser, or moles that have changed in shape or color.  Stay current with required vaccines (immunizations).  Influenza vaccine. All adults should be immunized every year.  Tetanus, diphtheria, and acellular pertussis (Td, Tdap) vaccine. An adult who has not previously received Tdap or who does not know his vaccine status should receive 1 dose of Tdap. This initial dose should be followed by tetanus and diphtheria toxoids (Td) booster doses every 10 years. Adults with an unknown or incomplete history of completing a 3-dose immunization series with Td-containing vaccines should begin or complete a primary immunization series including a Tdap dose. Adults should receive a Td booster every 10 years.  Varicella vaccine. An adult without evidence of immunity to varicella should receive 2 doses or a second dose if he has previously received 1 dose.  Human papillomavirus (HPV) vaccine. Males aged 40-21 years who have not received the vaccine previously should receive the 3-dose series. Males aged 22-26 years may be immunized. Immunization is recommended through the age of 39 years for any male who has sex with males and did not get any or all doses earlier. Immunization is recommended for any person with an immunocompromised condition through the age of 7 years if he did not get any or all doses earlier. During the 3-dose series, the second dose should be  obtained 4-8 weeks after the first dose. The third dose should be obtained 24 weeks after the first dose and 16 weeks after the second dose.  Zoster vaccine. One dose is recommended for adults aged 22 years or older unless certain conditions are present.  Measles, mumps, and rubella (MMR) vaccine. Adults born before 40 generally are considered immune to measles and mumps. Adults born in [birth year] or later should have 1 or more doses of MMR vaccine unless there is a contraindication to the vaccine or there is laboratory evidence of immunity to each of the three diseases. A routine second dose of MMR vaccine should be obtained at least  28 days after the first dose for students attending postsecondary schools, health care workers, or international travelers. People who received inactivated measles vaccine or an unknown type of measles vaccine during 1963-1967 should receive 2 doses of MMR vaccine. People who received inactivated mumps vaccine or an unknown type of mumps vaccine before 1979 and are at high risk for mumps infection should consider immunization with 2 doses of MMR vaccine. Unvaccinated health care workers born before [birth year] who lack laboratory evidence of measles, mumps, or rubella immunity or laboratory confirmation of disease should consider measles and mumps immunization with 2 doses of MMR vaccine or rubella immunization with 1 dose of MMR vaccine.  Pneumococcal 13-valent conjugate (PCV13) vaccine. When indicated, a person who is uncertain of his immunization history and has no record of immunization should receive the PCV13 vaccine. An adult aged 63 years or older who has certain medical conditions and has not been previously immunized should receive 1 dose of PCV13 vaccine. This PCV13 should be followed with a dose of pneumococcal polysaccharide (PPSV23) vaccine. The PPSV23 vaccine dose should be obtained at least 8 weeks after the dose of PCV13 vaccine. An adult aged 46 years or older who has  certain medical conditions and previously received 1 or more doses of PPSV23 vaccine should receive 1 dose of PCV13. The PCV13 vaccine dose should be obtained 1 or more years after the last PPSV23 vaccine dose.  Pneumococcal polysaccharide (PPSV23) vaccine. When PCV13 is also indicated, PCV13 should be obtained first. All adults aged 34 years and older should be immunized. An adult younger than age 69 years who has certain medical conditions should be immunized. Any person who resides in a nursing home or long-term care facility should be immunized. An adult smoker should be immunized. People with an immunocompromised condition and certain other conditions should receive both PCV13 and PPSV23 vaccines. People with human immunodeficiency virus (HIV) infection should be immunized as soon as possible after diagnosis. Immunization during chemotherapy or radiation therapy should be avoided. Routine use of PPSV23 vaccine is not recommended for American Indians, [place] Natives, or people younger than 65 years unless there are medical conditions that require PPSV23 vaccine. When indicated, people who have unknown immunization and have no record of immunization should receive PPSV23 vaccine. One-time revaccination 5 years after the first dose of PPSV23 is recommended for people aged 19-64 years who have chronic kidney failure, nephrotic syndrome, asplenia, or immunocompromised conditions. People who received 1-2 doses of PPSV23 before age 65 years should receive another dose of PPSV23 vaccine at age 62 years or later if at least 5 years have passed since the previous dose. Doses of PPSV23 are not needed for people immunized with PPSV23 at or after age 39 years.  Meningococcal vaccine. Adults with asplenia or persistent complement component deficiencies should receive 2 doses of quadrivalent meningococcal conjugate (MenACWY-D) vaccine. The doses should be obtained at least 2 months apart. Microbiologists working with  certain meningococcal bacteria, [employer] recruits, people at risk during an outbreak, and people who travel to or live in countries with a high rate of meningitis should be immunized. A first-year college student up through age 24 years who is living in a residence hall should receive a dose if he did not receive a dose on or after his 16th birthday. Adults who have certain high-risk conditions should receive one or more doses of vaccine.  Hepatitis A vaccine. Adults who wish to be protected from this disease, have certain high-risk conditions, work with  hepatitis A-infected animals, work in hepatitis A research labs, or travel to or work in countries with a high rate of hepatitis A should be immunized. Adults who were previously unvaccinated and who anticipate close contact with an international adoptee during the first 60 days after arrival in the [place] from a country with a high rate of hepatitis A should be immunized.  Hepatitis B vaccine. Adults should be immunized if they wish to be protected from this disease, have certain high-risk conditions, may be exposed to blood or other infectious body fluids, are household contacts or sex partners of hepatitis B positive people, are clients or workers in certain care facilities, or travel to or work in countries with a high rate of hepatitis B.  Haemophilus influenzae type b (Hib) vaccine. A previously unvaccinated person with asplenia or sickle cell disease or having a scheduled splenectomy should receive 1 dose of Hib vaccine. Regardless of previous immunization, a recipient of a hematopoietic stem cell transplant should receive a 3-dose series 6-12 months after his successful transplant. Hib vaccine is not recommended for adults with HIV infection. Preventive Service / Frequency Ages 84 to 2  Blood pressure check.** / Every 1 to 2 years.  Lipid and cholesterol check.** / Every 5 years beginning at age 85.  Hepatitis C blood test.** / For any  individual with known risks for hepatitis C.  Skin self-exam. / Monthly.  Influenza vaccine. / Every year.  Tetanus, diphtheria, and acellular pertussis (Tdap, Td) vaccine.** / Consult your health care provider. 1 dose of Td every 10 years.  Varicella vaccine.** / Consult your health care provider.  HPV vaccine. / 3 doses over 6 months, if 52 or younger.  Measles, mumps, rubella (MMR) vaccine.** / You need at least 1 dose of MMR if you were born in 1957 or later. You may also need a second dose.  Pneumococcal 13-valent conjugate (PCV13) vaccine.** / Consult your health care provider.  Pneumococcal polysaccharide (PPSV23) vaccine.** / 1 to 2 doses if you smoke cigarettes or if you have certain conditions.  Meningococcal vaccine.** / 1 dose if you are age 66 to 62 years and a Market researcher living in a residence hall, or have one of several medical conditions. You may also need additional booster doses.  Hepatitis A vaccine.** / Consult your health care provider.  Hepatitis B vaccine.** / Consult your health care provider.  Haemophilus influenzae type b (Hib) vaccine.** / Consult your health care provider. Ages 23 to 88  Blood pressure check.** / Every 1 to 2 years.  Lipid and cholesterol check.** / Every 5 years beginning at age 1.  Lung cancer screening. / Every year if you are aged 4-80 years and have a 30-pack-year history of smoking and currently smoke or have quit within the past 15 years. Yearly screening is stopped once you have quit smoking for at least 15 years or develop a health problem that would prevent you from having lung cancer treatment.  Fecal occult blood test (FOBT) of stool. / Every year beginning at age 5 and continuing until age 42. You may not have to do this test if you get a colonoscopy every 10 years.  Flexible sigmoidoscopy** or colonoscopy.** / Every 5 years for a flexible sigmoidoscopy or every 10 years for a colonoscopy beginning at age  35 and continuing until age 67.  Hepatitis C blood test.** / For all people born from [birth year] through 1966 and any individual with known risks for hepatitis C.  Skin self-exam. / Monthly.  Influenza vaccine. / Every year.  Tetanus, diphtheria, and acellular pertussis (Tdap/Td) vaccine.** / Consult your health care provider. 1 dose of Td every 10 years.  Varicella vaccine.** / Consult your health care provider.  Zoster vaccine.** / 1 dose for adults aged 48 years or older.  Measles, mumps, rubella (MMR) vaccine.** / You need at least 1 dose of MMR if you were born in 1958 or later. You may also need a second dose.  Pneumococcal 13-valent conjugate (PCV13) vaccine.** / Consult your health care provider.  Pneumococcal polysaccharide (PPSV23) vaccine.** / 1 to 2 doses if you smoke cigarettes or if you have certain conditions.  Meningococcal vaccine.** / Consult your health care provider.  Hepatitis A vaccine.** / Consult your health care provider.  Hepatitis B vaccine.** / Consult your health care provider.  Haemophilus influenzae type b (Hib) vaccine.** / Consult your health care provider. Ages 51 and over  Blood pressure check.** / Every 1 to 2 years.  Lipid and cholesterol check.**/ Every 5 years beginning at age 107.  Lung cancer screening. / Every year if you are aged 50-80 years and have a 30-pack-year history of smoking and currently smoke or have quit within the past 15 years. Yearly screening is stopped once you have quit smoking for at least 15 years or develop a health problem that would prevent you from having lung cancer treatment.  Fecal occult blood test (FOBT) of stool. / Every year beginning at age 88 and continuing until age 50. You may not have to do this test if you get a colonoscopy every 10 years.  Flexible sigmoidoscopy** or colonoscopy.** / Every 5 years for a flexible sigmoidoscopy or every 10 years for a colonoscopy beginning at age 32 and continuing until age  78.  Hepatitis C blood test.** / For all people born from [birth year] through 1966 and any individual with known risks for hepatitis C.  Abdominal aortic aneurysm (AAA) screening.** / A one-time screening for ages 61 to 36 years who are current or former smokers.  Skin self-exam. / Monthly.  Influenza vaccine. / Every year.  Tetanus, diphtheria, and acellular pertussis (Tdap/Td) vaccine.** / 1 dose of Td every 10 years.  Varicella vaccine.** / Consult your health care provider.  Zoster vaccine.** / 1 dose for adults aged 72 years or older.  Pneumococcal 13-valent conjugate (PCV13) vaccine.** / Consult your health care provider.  Pneumococcal polysaccharide (PPSV23) vaccine.** / 1 dose for all adults aged 72 years and older.  Meningococcal vaccine.** / Consult your health care provider.  Hepatitis A vaccine.** / Consult your health care provider.  Hepatitis B vaccine.** / Consult your health care provider.  Haemophilus influenzae type b (Hib) vaccine.** / Consult your health care provider. **Family history and personal history of risk and conditions may change your health care provider's recommendations. Document Released: 03/21/2001 Document Revised: 01/28/2013 Document Reviewed: 06/20/2010 [facility] Patient Information 2015 [place], [place]. This information is not intended to replace advice given to you by your health care provider. Make sure you discuss any questions you have with your health care provider. PSA to next years labs

## 2013-07-24 NOTE — Progress Notes (Signed)
Pre visit review using our clinic review tool, if applicable. No additional management support is needed unless otherwise documented below in the visit note. 

## 2013-08-03 ENCOUNTER — Encounter: Payer: Self-pay | Admitting: Family Medicine

## 2013-08-03 DIAGNOSIS — L918 Other hypertrophic disorders of the skin: Secondary | ICD-10-CM | POA: Insufficient documentation

## 2013-08-03 DIAGNOSIS — Z800 Family history of malignant neoplasm of digestive organs: Secondary | ICD-10-CM | POA: Insufficient documentation

## 2013-08-03 NOTE — Assessment & Plan Note (Signed)
Request old records, Patient encouraged to maintain heart healthy diet, regular exercise, adequate sleep. Consider daily probiotics. Take medications as prescribed

## 2013-08-03 NOTE — Assessment & Plan Note (Signed)
Stable on current meds 

## 2013-08-03 NOTE — Assessment & Plan Note (Signed)
Some irritated and sun damaged skin, referred to Dermatology

## 2013-08-03 NOTE — Progress Notes (Signed)
Patient ID: Karl HarkJeffrey S Dhillon, male   DOB: 1965/04/07, 48 y.o.   MRN: [id number] Karl Jacobs [id number] 1965/04/07 08/03/2013      Progress Note New Patient  Subjective  Chief Complaint  Chief Complaint  Patient presents with  . Establish Care    new patient    HPI  Patient is a 48 year old male in today for routine medical care. Patient is in today to establish care. He struggles with chronic back pain status post 3 surgeries but is managing with the help of a pain clinic at this time. Struggles also is PTSD and depression and is managing that with the help of psychiatry. Had a recent illness with some fatigue malaise lightheadedness and blurry vision was seen in an urgent care facility and feels improved at this time. He is complaining of multiple irritated skin tags but offers no other acute complaints at this visit. Denies CP/palp/SOB/HA/congestion/fevers/GI or GU c/o. Taking meds as prescribed  Past Medical History  Diagnosis Date  . Post traumatic stress disorder (PTSD)     H/O .Marland Kitchen....  "FELT BETTER IN 2006"  . Chronic back pain   . Depression     Past Surgical History  Procedure Laterality Date  . Back surgery      X 3  . Tonsillectomy    . Appendectomy    . Spinal cord stimulator insertion  10/19/2011    Procedure: LUMBAR SPINAL CORD STIMULATOR INSERTION;  Surgeon: Venita Lickahari Brooks, MD;  Location: [facility] OR;  Service: Orthopedics;  Laterality: N/A;  SPINAL CORD STIMULATOR PLACEMENT  . Tonsillectomy    . Wisdom tooth extraction    . Vasectomy      & REVERSAL IN 2008    Family History  Problem Relation Age of Onset  . Other Mother     Paget's Disease   . Hypertension Father   . Diabetes Father     type 2  . Cancer Father     hodgkins lymphoma  . Hyperlipidemia Father   . Arthritis Father   . Other Paternal Grandfather     carbon monoxide  . Stroke Paternal Grandfather   . Alcohol abuse Brother   . Cancer Brother 48    colon ca, polyp  . Stroke Maternal  Grandfather     ?  Marland Kitchen. Pneumonia Paternal Grandmother     History   Social History  . Marital Status: Married    Spouse Name: N/A    Number of Children: N/A  . Years of Education: N/A   Occupational History  . Not on file.   Social History Main Topics  . Smoking status: Former Smoker -- 1.00 packs/day for 30 years    Types: Cigarettes    Quit date: 11/06/2012  . Smokeless tobacco: Never Used     Comment: Using Vapor Cigarette to quit  . Alcohol Use: No  . Drug Use: No  . Sexual Activity: Yes    Birth Control/ Protection: None     Comment: lives with wife, daughers, son  (when from college), no dietary restrictions   Other Topics Concern  . Not on file   Social History Narrative  . No narrative on file    Current Outpatient Prescriptions on File Prior to Visit  Medication Sig Dispense Refill  . buprenorphine-naloxone (SUBOXONE) 8-2 MG SUBL SL tablet Place 1 tablet under the tongue 3 (three) times daily.       No current facility-administered medications on file prior to visit.  No Known Allergies  Review of Systems  Review of Systems  Constitutional: Negative for fever, chills and malaise/fatigue.  HENT: Negative for congestion, hearing loss and nosebleeds.   Eyes: Negative for discharge.  Respiratory: Negative for cough, sputum production, shortness of breath and wheezing.   Cardiovascular: Negative for chest pain, palpitations and leg swelling.  Gastrointestinal: Negative for heartburn, nausea, vomiting, abdominal pain, diarrhea, constipation and blood in stool.  Genitourinary: Negative for dysuria, urgency, frequency and hematuria.  Musculoskeletal: Negative for back pain, falls and myalgias.  Skin: Negative for rash.  Neurological: Negative for dizziness, tremors, sensory change, focal weakness, loss of consciousness, weakness and headaches.  Endo/Heme/Allergies: Negative for polydipsia. Does not bruise/bleed easily.  Psychiatric/Behavioral: Negative for  depression and suicidal ideas. The patient is not nervous/anxious and does not have insomnia.     Objective  BP 122/70  Pulse 70  Temp(Src) 98.2 F (36.8 C) (Oral)  Ht 6' (1.829 m)  Wt 187 lb 1.3 oz (84.859 kg)  BMI 25.37 kg/m2  SpO2 97%  Physical Exam  Physical Exam  Constitutional: He is oriented to person, place, and time and well-developed, well-nourished, and in no distress. No distress.  HENT:  Head: Normocephalic and atraumatic.  Eyes: Conjunctivae are normal.  Neck: Neck supple. No thyromegaly present.  Cardiovascular: Normal rate, regular rhythm and normal heart sounds.   No murmur heard. Pulmonary/Chest: Effort normal and breath sounds normal. No respiratory distress.  Abdominal: He exhibits no distension and no mass. There is no tenderness.  Musculoskeletal: He exhibits no edema.  Neurological: He is alert and oriented to person, place, and time.  Skin: Skin is warm.  Psychiatric: Memory, affect and judgment normal.       Assessment & Plan  PTSD (post-traumatic stress disorder) Stable on current meds  FH: colon cancer Patient referred to GI for initial colonoscopy due to FH of colon cancer and age  Encounter to establish care Request old records, Patient encouraged to maintain heart healthy diet, regular exercise, adequate sleep. Consider daily probiotics. Take medications as prescribed  Cutaneous skin tags Some irritated and sun damaged skin, referred to Dermatology

## 2013-08-03 NOTE — Assessment & Plan Note (Signed)
Patient referred to GI for initial colonoscopy due to FH of colon cancer and age

## 2013-09-23 ENCOUNTER — Telehealth: Payer: Self-pay | Admitting: Family Medicine

## 2013-09-23 NOTE — Telephone Encounter (Signed)
Patient states that Dr. Abner GreenspanBlyth has referred him to a dermatologist but he would like to be referred to Dermatologist Specialist either Dr. Emily FilbertGould or Dr. Marko StaiHavestock.

## 2013-09-23 NOTE — Telephone Encounter (Signed)
Please place new referral.

## 2013-09-23 NOTE — Telephone Encounter (Signed)
Please advise 

## 2013-09-23 NOTE — Telephone Encounter (Signed)
I am happy to send him to Dr Emily FilbertGould or Dr Sharyn LullHaverstock do I need to create a new referral or can we use the one from June?

## 2013-09-25 ENCOUNTER — Telehealth: Payer: Self-pay | Admitting: Family Medicine

## 2013-09-25 ENCOUNTER — Other Ambulatory Visit: Payer: Self-pay | Admitting: Family Medicine

## 2013-09-25 DIAGNOSIS — L578 Other skin changes due to chronic exposure to nonionizing radiation: Secondary | ICD-10-CM

## 2013-09-25 NOTE — Telephone Encounter (Signed)
Pt calling about referral again, can you please place new order for Dermatology. Requesting to see Dr Sharyn LullHaverstock or Emily FilbertGould

## 2014-01-21 ENCOUNTER — Encounter: Payer: Self-pay | Admitting: Physician Assistant

## 2014-01-21 ENCOUNTER — Ambulatory Visit (INDEPENDENT_AMBULATORY_CARE_PROVIDER_SITE_OTHER): Payer: BC Managed Care – PPO | Admitting: Physician Assistant

## 2014-01-21 VITALS — BP 106/67 | HR 64 | Temp 98.5°F | Resp 18 | Ht 72.0 in | Wt 208.2 lb

## 2014-01-21 DIAGNOSIS — M5417 Radiculopathy, lumbosacral region: Secondary | ICD-10-CM

## 2014-01-21 DIAGNOSIS — M5416 Radiculopathy, lumbar region: Secondary | ICD-10-CM | POA: Insufficient documentation

## 2014-01-21 MED ORDER — CYCLOBENZAPRINE HCL 10 MG PO TABS: 10.0000 mg | ORAL_TABLET | Freq: Three times a day (TID) | ORAL | Status: DC | PRN

## 2014-01-21 MED ORDER — METHYLPREDNISOLONE ACETATE 80 MG/ML IJ SUSP
80.0000 mg | Freq: Once | INTRAMUSCULAR | Status: AC
  Administered 2014-01-21: 80 mg via INTRAMUSCULAR

## 2014-01-21 MED ORDER — METHYLPREDNISOLONE (PAK) 4 MG PO TABS: ORAL_TABLET | ORAL | Status: DC

## 2014-01-21 NOTE — Patient Instructions (Signed)
Please start Steroid pack tomorrow as directed.  Take Flexeril as directed but do not drive or operate heavy machinery while on this medication. Follow-up with Pain Clinic for pain management.  You will be contacted by Neurosurgery for further assessment.

## 2014-01-21 NOTE — Progress Notes (Signed)
   Patient presents to clinic today c/o lower back pain radiating down into right lower extremity x 2 weeks.  Denies numbness but endorses chronic right lower extremity tingling after injury and implantation. Denies saddle anesthesia or change to bowel or bladder habits.  Is currently on Suboxone with Pain Clinic follow-up. Had CT in 11/2012 with evidence of severe disc disease.  Patient has a permanent spinal stimulator.  Past Medical History  Diagnosis Date  . Post traumatic stress disorder (PTSD)     H/O .Karl Jacobs....  "FELT BETTER IN 2006"  . Chronic back pain   . Depression     Current Outpatient Prescriptions on File Prior to Visit  Medication Sig Dispense Refill  . buprenorphine-naloxone (SUBOXONE) 8-2 MG SUBL SL tablet Place 1 tablet under the tongue 3 (three) times daily.     No current facility-administered medications on file prior to visit.    No Known Allergies  Family History  Problem Relation Age of Onset  . Other Mother     Paget's Disease   . Hypertension Father   . Diabetes Father     type 2  . Cancer Father     hodgkins lymphoma  . Hyperlipidemia Father   . Arthritis Father   . Other Paternal Grandfather     carbon monoxide  . Stroke Paternal Grandfather   . Alcohol abuse Brother   . Cancer Brother 48    colon ca, polyp  . Stroke Maternal Grandfather     ?  Karl Jacobs. Pneumonia Paternal Grandmother     History   Social History  . Marital Status: Married    Spouse Name: N/A    Number of Children: N/A  . Years of Education: N/A   Social History Main Topics  . Smoking status: Former Smoker -- 1.00 packs/day for 30 years    Types: Cigarettes    Quit date: 11/06/2012  . Smokeless tobacco: Never Used     Comment: Using Vapor Cigarette to quit  . Alcohol Use: No  . Drug Use: No  . Sexual Activity: Yes    Birth Control/ Protection: None     Comment: lives with wife, daughers, son  (when from college), no dietary restrictions   Other Topics Concern  . None    Social History Narrative   Review of Systems - See HPI.  All other ROS are negative.  BP 106/67 mmHg  Pulse 64  Temp(Src) 98.5 F (36.9 C) (Oral)  Resp 18  Ht 6' (1.829 m)  Wt 208 lb 4 oz (94.462 kg)  BMI 28.24 kg/m2  SpO2 98%  Physical Exam  Constitutional: He is oriented to person, place, and time and well-developed, well-nourished, and in no distress.  HENT:  Head: Normocephalic and atraumatic.  Cardiovascular: Normal rate, regular rhythm, normal heart sounds and intact distal pulses.   Pulmonary/Chest: Effort normal and breath sounds normal. No respiratory distress. He has no wheezes. He has no rales. He exhibits no tenderness.  Musculoskeletal:       Lumbar back: He exhibits tenderness, pain and spasm. He exhibits no bony tenderness.  Neurological: He is alert and oriented to person, place, and time.  Psychiatric: Affect normal.  Vitals reviewed.  Assessment/Plan: Lumbar back pain with radiculopathy affecting right lower extremity Im Depomedrol given by RN.  Start Medrol pack tomorrow.  Rx Flexeril.  Referral to Neurosurgery placed.  Continue Suboxone as directed by Pain specialist.  Alarm signs/symptoms discussed with patient.

## 2014-01-21 NOTE — Addendum Note (Signed)
Addended by: Jackson LatinoYLER, Nidia Grogan L on: 01/21/2014 04:22 PM   Modules accepted: Orders

## 2014-01-21 NOTE — Progress Notes (Signed)
Pre visit review using our clinic review tool, if applicable. No additional management support is needed unless otherwise documented below in the visit note/SLS  

## 2014-01-21 NOTE — Assessment & Plan Note (Signed)
Im Depomedrol given by RN.  Start Medrol pack tomorrow.  Rx Flexeril.  Referral to Neurosurgery placed.  Continue Suboxone as directed by Pain specialist.  Alarm signs/symptoms discussed with patient.

## 2014-07-08 ENCOUNTER — Telehealth: Payer: Self-pay | Admitting: Family Medicine

## 2014-07-08 NOTE — Telephone Encounter (Signed)
Pre Visit letter sent  °

## 2014-07-24 ENCOUNTER — Telehealth: Payer: Self-pay | Admitting: *Deleted

## 2014-07-24 NOTE — Telephone Encounter (Signed)
Unable to reach patient at time of Pre-Visit Call.  Left message for patient to return call when available.    

## 2014-07-27 ENCOUNTER — Encounter: Payer: Self-pay | Admitting: Family Medicine

## 2014-07-27 ENCOUNTER — Ambulatory Visit (INDEPENDENT_AMBULATORY_CARE_PROVIDER_SITE_OTHER): Payer: BLUE CROSS/BLUE SHIELD | Admitting: Family Medicine

## 2014-07-27 VITALS — BP 94/64 | HR 71 | Temp 98.7°F | Ht 72.0 in | Wt 207.4 lb

## 2014-07-27 DIAGNOSIS — Z0000 Encounter for general adult medical examination without abnormal findings: Secondary | ICD-10-CM

## 2014-07-27 NOTE — Progress Notes (Signed)
Pre visit review using our clinic review tool, if applicable. No additional management support is needed unless otherwise documented below in the visit note. 

## 2014-07-29 ENCOUNTER — Telehealth: Payer: Self-pay | Admitting: Family Medicine

## 2014-07-29 NOTE — Telephone Encounter (Signed)
Do not charge, he had to wait, just make sure he does not want to come in sooner. I could come in early on some Tuesday mornings in September on or the secoornd of August.

## 2014-07-29 NOTE — Telephone Encounter (Signed)
Pt left office without being seen 07/27/14 2:30pm, CPE, rescheduled for 02/02/2015 4:00pm, charge?

## 2014-07-30 NOTE — Telephone Encounter (Signed)
Left msg for pt to see if he wants to come in sooner per Dr. Abner Greenspan (see below)

## 2014-08-06 NOTE — Progress Notes (Signed)
Patient had to leave without being seen.  

## 2015-02-02 ENCOUNTER — Encounter: Payer: BLUE CROSS/BLUE SHIELD | Admitting: Family Medicine

## 2015-02-09 MED FILL — VIIBRYD 20 MG TABLET: 20 | 30 days supply | Qty: 30 | Fill #2

## 2015-03-01 MED FILL — LATUDA 40 MG TABLET: 40 | 30 days supply | Qty: 30 | Fill #1

## 2015-03-03 DIAGNOSIS — F332 Major depressive disorder, recurrent severe without psychotic features: Secondary | ICD-10-CM | POA: Diagnosis not present

## 2015-03-03 DIAGNOSIS — F1121 Opioid dependence, in remission: Secondary | ICD-10-CM | POA: Diagnosis not present

## 2015-03-03 DIAGNOSIS — F4312 Post-traumatic stress disorder, chronic: Secondary | ICD-10-CM | POA: Diagnosis not present

## 2015-03-03 MED FILL — SUBOXONE 8 MG-2 MG SL FILM: 8-2 | 30 days supply | Qty: 90 | Fill #0

## 2015-03-12 MED FILL — VIIBRYD 20 MG TABLET: 20 | 30 days supply | Qty: 30 | Fill #3

## 2015-04-01 MED FILL — LATUDA 40 MG TABLET: 40 | 30 days supply | Qty: 30 | Fill #2

## 2015-04-06 MED FILL — SUBOXONE 8 MG-2 MG SL FILM: 8-2 | 30 days supply | Qty: 90 | Fill #0

## 2015-04-08 ENCOUNTER — Telehealth: Payer: Self-pay | Admitting: *Deleted

## 2015-04-08 ENCOUNTER — Encounter: Payer: Self-pay | Admitting: *Deleted

## 2015-04-08 NOTE — Telephone Encounter (Signed)
Pre-Visit Call completed with patient and chart updated.   Pre-Visit Info documented in Specialty Comments under [employer].    

## 2015-04-08 NOTE — Telephone Encounter (Signed)
Unable to reach patient at time of pre-visit call. Left message for patient to return call when available.  

## 2015-04-09 ENCOUNTER — Encounter: Payer: Self-pay | Admitting: Family Medicine

## 2015-04-09 ENCOUNTER — Ambulatory Visit (INDEPENDENT_AMBULATORY_CARE_PROVIDER_SITE_OTHER): Payer: 59 | Admitting: Family Medicine

## 2015-04-09 ENCOUNTER — Ambulatory Visit (HOSPITAL_BASED_OUTPATIENT_CLINIC_OR_DEPARTMENT_OTHER)
Admission: RE | Admit: 2015-04-09 | Discharge: 2015-04-09 | Disposition: A | Discharge status: Home / Self Care | Payer: 59 | Source: Ambulatory Visit | Attending: Family Medicine | Admitting: Family Medicine

## 2015-04-09 VITALS — BP 122/68 | HR 73 | Temp 98.5°F | Ht 72.0 in | Wt 207.0 lb

## 2015-04-09 DIAGNOSIS — R739 Hyperglycemia, unspecified: Secondary | ICD-10-CM | POA: Diagnosis not present

## 2015-04-09 DIAGNOSIS — Z1211 Encounter for screening for malignant neoplasm of colon: Secondary | ICD-10-CM | POA: Diagnosis not present

## 2015-04-09 DIAGNOSIS — E782 Mixed hyperlipidemia: Secondary | ICD-10-CM

## 2015-04-09 DIAGNOSIS — M545 Low back pain: Secondary | ICD-10-CM | POA: Diagnosis not present

## 2015-04-09 DIAGNOSIS — Z0000 Encounter for general adult medical examination without abnormal findings: Secondary | ICD-10-CM

## 2015-04-09 DIAGNOSIS — B977 Papillomavirus as the cause of diseases classified elsewhere: Secondary | ICD-10-CM | POA: Diagnosis not present

## 2015-04-09 DIAGNOSIS — M5416 Radiculopathy, lumbar region: Secondary | ICD-10-CM

## 2015-04-09 MED ORDER — IMIQUIMOD 5 % EX CREA: TOPICAL_CREAM | CUTANEOUS | Status: DC

## 2015-04-09 MED FILL — IMIQUIMOD 5% CREAM PACKET: 5 | 30 days supply | Qty: 12 | Fill #0

## 2015-04-09 NOTE — Patient Instructions (Addendum)
[employer] 10 strain probiotic 1 cap daily Cetaphil soap and lotion Witch Hazel Astringent to irritated skin   Aspercreme or Salon Pas patches for the back, Lidocaine    Preventive Care for Adults, Male A healthy lifestyle and preventive care can promote health and wellness. Preventive health guidelines for men include the following key practices:  A routine yearly physical is a good way to check with your health care provider about your health and preventative screening. It is a chance to share any concerns and updates on your health and to receive a thorough exam.  Visit your dentist for a routine exam and preventative care every 6 months. Brush your teeth twice a day and floss once a day. Good oral hygiene prevents tooth decay and gum disease.  The frequency of eye exams is based on your age, health, family medical history, use of contact lenses, and other factors. Follow your health care provider's recommendations for frequency of eye exams.  Eat a healthy diet. Foods such as vegetables, fruits, whole grains, low-fat dairy products, and lean protein foods contain the nutrients you need without too many calories. Decrease your intake of foods high in solid fats, added sugars, and salt. Eat the right amount of calories for you.Get information about a proper diet from your health care provider, if necessary.  Regular physical exercise is one of the most important things you can do for your health. Most adults should get at least 150 minutes of moderate-intensity exercise (any activity that increases your heart rate and causes you to sweat) each week. In addition, most adults need muscle-strengthening exercises on 2 or more days a week.  Maintain a healthy weight. The body mass index (BMI) is a screening tool to identify possible weight problems. It provides an estimate of body fat based on height and weight. Your health care provider can find your BMI and can help you achieve or maintain a  healthy weight.For adults 20 years and older:  A BMI below 18.5 is considered underweight.  A BMI of 18.5 to 24.9 is normal.  A BMI of 25 to 29.9 is considered overweight.  A BMI of 30 and above is considered obese.  Maintain normal blood lipids and cholesterol levels by exercising and minimizing your intake of saturated fat. Eat a balanced diet with plenty of fruit and vegetables. Blood tests for lipids and cholesterol should begin at age 64 and be repeated every 5 years. If your lipid or cholesterol levels are high, you are over 50, or you are at high risk for heart disease, you may need your cholesterol levels checked more frequently.Ongoing high lipid and cholesterol levels should be treated with medicines if diet and exercise are not working.  If you smoke, find out from your health care provider how to quit. If you do not use tobacco, do not start.  Lung cancer screening is recommended for adults aged 110-80 years who are at high risk for developing lung cancer because of a history of smoking. A yearly low-dose CT scan of the lungs is recommended for people who have at least a 30-pack-year history of smoking and are a current smoker or have quit within the past 15 years. A pack year of smoking is smoking an average of 1 pack of cigarettes a day for 1 year (for example: 1 pack a day for 30 years or 2 packs a day for 15 years). Yearly screening should continue until the smoker has stopped smoking for at least 15 years.  Yearly screening should be stopped for people who develop a health problem that would prevent them from having lung cancer treatment.  If you choose to drink alcohol, do not have more than 2 drinks per day. One drink is considered to be 12 ounces (355 mL) of beer, 5 ounces (148 mL) of wine, or 1.5 ounces (44 mL) of liquor.  Avoid use of street drugs. Do not share needles with anyone. Ask for help if you need support or instructions about stopping the use of drugs.  High blood  pressure causes heart disease and increases the risk of stroke. Your blood pressure should be checked at least every 1-2 years. Ongoing high blood pressure should be treated with medicines, if weight loss and exercise are not effective.  If you are 87-83 years old, ask your health care provider if you should take aspirin to prevent heart disease.  Diabetes screening is done by taking a blood sample to check your blood glucose level after you have not eaten for a certain period of time (fasting). If you are not overweight and you do not have risk factors for diabetes, you should be screened once every 3 years starting at age 20. If you are overweight or obese and you are 45-31 years of age, you should be screened for diabetes every year as part of your cardiovascular risk assessment.  Colorectal cancer can be detected and often prevented. Most routine colorectal cancer screening begins at the age of 44 and continues through age 6. However, your health care provider may recommend screening at an earlier age if you have risk factors for colon cancer. On a yearly basis, your health care provider may provide home test kits to check for hidden blood in the stool. Use of a small camera at the end of a tube to directly examine the colon (sigmoidoscopy or colonoscopy) can detect the earliest forms of colorectal cancer. Talk to your health care provider about this at age 15, when routine screening begins. Direct exam of the colon should be repeated every 5-10 years through age 73, unless early forms of precancerous polyps or small growths are found.  People who are at an increased risk for hepatitis B should be screened for this virus. You are considered at high risk for hepatitis B if:  You were born in a country where hepatitis B occurs often. Talk with your health care provider about which countries are considered high risk.  Your parents were born in a high-risk country and you have not received a shot to  protect against hepatitis B (hepatitis B vaccine).  You have HIV or AIDS.  You use needles to inject street drugs.  You live with, or have sex with, someone who has hepatitis B.  You are a man who has sex with other men (MSM).  You get hemodialysis treatment.  You take certain medicines for conditions such as cancer, organ transplantation, and autoimmune conditions.  Hepatitis C blood testing is recommended for all people born from [birth year] through 1965 and any individual with known risks for hepatitis C.  Practice safe sex. Use condoms and avoid high-risk sexual practices to reduce the spread of sexually transmitted infections (STIs). STIs include gonorrhea, chlamydia, syphilis, trichomonas, herpes, HPV, and human immunodeficiency virus (HIV). Herpes, HIV, and HPV are viral illnesses that have no cure. They can result in disability, cancer, and death.  If you are a man who has sex with other men, you should be screened at least once per year  for:  HIV.  Urethral, rectal, and pharyngeal infection of gonorrhea, chlamydia, or both.  If you are at risk of being infected with HIV, it is recommended that you take a prescription medicine daily to prevent HIV infection. This is called preexposure prophylaxis (PrEP). You are considered at risk if:  You are a man who has sex with other men (MSM) and have other risk factors.  You are a heterosexual man, are sexually active, and are at increased risk for HIV infection.  You take drugs by injection.  You are sexually active with a partner who has HIV.  Talk with your health care provider about whether you are at high risk of being infected with HIV. If you choose to begin PrEP, you should first be tested for HIV. You should then be tested every 3 months for as long as you are taking PrEP.  A one-time screening for abdominal aortic aneurysm (AAA) and surgical repair of large AAAs by ultrasound are recommended for men ages 56 to 81 years who are  current or former smokers.  Healthy men should no longer receive prostate-specific antigen (PSA) blood tests as part of routine cancer screening. Talk with your health care provider about prostate cancer screening.  Testicular cancer screening is not recommended for adult males who have no symptoms. Screening includes self-exam, a health care provider exam, and other screening tests. Consult with your health care provider about any symptoms you have or any concerns you have about testicular cancer.  Use sunscreen. Apply sunscreen liberally and repeatedly throughout the day. You should seek shade when your shadow is shorter than you. Protect yourself by wearing long sleeves, pants, a wide-brimmed hat, and sunglasses year round, whenever you are outdoors.  Once a month, do a whole-body skin exam, using a mirror to look at the skin on your back. Tell your health care provider about new moles, moles that have irregular borders, moles that are larger than a pencil eraser, or moles that have changed in shape or color.  Stay current with required vaccines (immunizations).  Influenza vaccine. All adults should be immunized every year.  Tetanus, diphtheria, and acellular pertussis (Td, Tdap) vaccine. An adult who has not previously received Tdap or who does not know his vaccine status should receive 1 dose of Tdap. This initial dose should be followed by tetanus and diphtheria toxoids (Td) booster doses every 10 years. Adults with an unknown or incomplete history of completing a 3-dose immunization series with Td-containing vaccines should begin or complete a primary immunization series including a Tdap dose. Adults should receive a Td booster every 10 years.  Varicella vaccine. An adult without evidence of immunity to varicella should receive 2 doses or a second dose if he has previously received 1 dose.  Human papillomavirus (HPV) vaccine. Males aged 11-21 years who have not received the vaccine  previously should receive the 3-dose series. Males aged 22-26 years may be immunized. Immunization is recommended through the age of 27 years for any male who has sex with males and did not get any or all doses earlier. Immunization is recommended for any person with an immunocompromised condition through the age of 4 years if he did not get any or all doses earlier. During the 3-dose series, the second dose should be obtained 4-8 weeks after the first dose. The third dose should be obtained 24 weeks after the first dose and 16 weeks after the second dose.  Zoster vaccine. One dose is recommended for adults aged  60 years or older unless certain conditions are present.  Measles, mumps, and rubella (MMR) vaccine. Adults born before [birth year] generally are considered immune to measles and mumps. Adults born in [birth year] or later should have 1 or more doses of MMR vaccine unless there is a contraindication to the vaccine or there is laboratory evidence of immunity to each of the three diseases. A routine second dose of MMR vaccine should be obtained at least 28 days after the first dose for students attending postsecondary schools, health care workers, or international travelers. People who received inactivated measles vaccine or an unknown type of measles vaccine during 1963-1967 should receive 2 doses of MMR vaccine. People who received inactivated mumps vaccine or an unknown type of mumps vaccine before 1979 and are at high risk for mumps infection should consider immunization with 2 doses of MMR vaccine. Unvaccinated health care workers born before [birth year] who lack laboratory evidence of measles, mumps, or rubella immunity or laboratory confirmation of disease should consider measles and mumps immunization with 2 doses of MMR vaccine or rubella immunization with 1 dose of MMR vaccine.  Pneumococcal 13-valent conjugate (PCV13) vaccine. When indicated, a person who is uncertain of his immunization history and has no record  of immunization should receive the PCV13 vaccine. All adults 21 years of age and older should receive this vaccine. An adult aged 65 years or older who has certain medical conditions and has not been previously immunized should receive 1 dose of PCV13 vaccine. This PCV13 should be followed with a dose of pneumococcal polysaccharide (PPSV23) vaccine. Adults who are at high risk for pneumococcal disease should obtain the PPSV23 vaccine at least 8 weeks after the dose of PCV13 vaccine. Adults older than 51 years of age who have normal immune system function should obtain the PPSV23 vaccine dose at least 1 year after the dose of PCV13 vaccine.  Pneumococcal polysaccharide (PPSV23) vaccine. When PCV13 is also indicated, PCV13 should be obtained first. All adults aged 73 years and older should be immunized. An adult younger than age 56 years who has certain medical conditions should be immunized. Any person who resides in a nursing home or long-term care facility should be immunized. An adult smoker should be immunized. People with an immunocompromised condition and certain other conditions should receive both PCV13 and PPSV23 vaccines. People with human immunodeficiency virus (HIV) infection should be immunized as soon as possible after diagnosis. Immunization during chemotherapy or radiation therapy should be avoided. Routine use of PPSV23 vaccine is not recommended for American Indians, [place] Natives, or people younger than 65 years unless there are medical conditions that require PPSV23 vaccine. When indicated, people who have unknown immunization and have no record of immunization should receive PPSV23 vaccine. One-time revaccination 5 years after the first dose of PPSV23 is recommended for people aged 19-64 years who have chronic kidney failure, nephrotic syndrome, asplenia, or immunocompromised conditions. People who received 1-2 doses of PPSV23 before age 10 years should receive another dose of PPSV23 vaccine  at age 62 years or later if at least 5 years have passed since the previous dose. Doses of PPSV23 are not needed for people immunized with PPSV23 at or after age 52 years.  Meningococcal vaccine. Adults with asplenia or persistent complement component deficiencies should receive 2 doses of quadrivalent meningococcal conjugate (MenACWY-D) vaccine. The doses should be obtained at least 2 months apart. Microbiologists working with certain meningococcal bacteria, [employer] recruits, people at risk during an outbreak, and people who travel to  or live in countries with a high rate of meningitis should be immunized. A first-year college student up through age 74 years who is living in a residence hall should receive a dose if he did not receive a dose on or after his 16th birthday. Adults who have certain high-risk conditions should receive one or more doses of vaccine.  Hepatitis A vaccine. Adults who wish to be protected from this disease, have chronic liver disease, work with hepatitis A-infected animals, work in hepatitis A research labs, or travel to or work in countries with a high rate of hepatitis A should be immunized. Adults who were previously unvaccinated and who anticipate close contact with an international adoptee during the first 60 days after arrival in the [place] from a country with a high rate of hepatitis A should be immunized.  Hepatitis B vaccine. Adults should be immunized if they wish to be protected from this disease, are under age 12 years and have diabetes, have chronic liver disease, have had more than one sex partner in the past 6 months, may be exposed to blood or other infectious body fluids, are household contacts or sex partners of hepatitis B positive people, are clients or workers in certain care facilities, or travel to or work in countries with a high rate of hepatitis B.  Haemophilus influenzae type b (Hib) vaccine. A previously unvaccinated person with asplenia or sickle  cell disease or having a scheduled splenectomy should receive 1 dose of Hib vaccine. Regardless of previous immunization, a recipient of a hematopoietic stem cell transplant should receive a 3-dose series 6-12 months after his successful transplant. Hib vaccine is not recommended for adults with HIV infection. Preventive Service / Frequency Ages 34 to 43  Blood pressure check.** / Every 3-5 years.  Lipid and cholesterol check.** / Every 5 years beginning at age 57.  Hepatitis C blood test.** / For any individual with known risks for hepatitis C.  Skin self-exam. / Monthly.  Influenza vaccine. / Every year.  Tetanus, diphtheria, and acellular pertussis (Tdap, Td) vaccine.** / Consult your health care provider. 1 dose of Td every 10 years.  Varicella vaccine.** / Consult your health care provider.  HPV vaccine. / 3 doses over 6 months, if 61 or younger.  Measles, mumps, rubella (MMR) vaccine.** / You need at least 1 dose of MMR if you were born in 1957 or later. You may also need a second dose.  Pneumococcal 13-valent conjugate (PCV13) vaccine.** / Consult your health care provider.  Pneumococcal polysaccharide (PPSV23) vaccine.** / 1 to 2 doses if you smoke cigarettes or if you have certain conditions.  Meningococcal vaccine.** / 1 dose if you are age 59 to 8 years and a Market researcher living in a residence hall, or have one of several medical conditions. You may also need additional booster doses.  Hepatitis A vaccine.** / Consult your health care provider.  Hepatitis B vaccine.** / Consult your health care provider.  Haemophilus influenzae type b (Hib) vaccine.** / Consult your health care provider. Ages 5 to 14  Blood pressure check.** / Every year.  Lipid and cholesterol check.** / Every 5 years beginning at age 53.  Lung cancer screening. / Every year if you are aged 83-80 years and have a 30-pack-year history of smoking and currently smoke or have quit within  the past 15 years. Yearly screening is stopped once you have quit smoking for at least 15 years or develop a health problem that would prevent  you from having lung cancer treatment.  Fecal occult blood test (FOBT) of stool. / Every year beginning at age 47 and continuing until age 50. You may not have to do this test if you get a colonoscopy every 10 years.  Flexible sigmoidoscopy** or colonoscopy.** / Every 5 years for a flexible sigmoidoscopy or every 10 years for a colonoscopy beginning at age 57 and continuing until age 47.  Hepatitis C blood test.** / For all people born from [birth year] through 1965 and any individual with known risks for hepatitis C.  Skin self-exam. / Monthly.  Influenza vaccine. / Every year.  Tetanus, diphtheria, and acellular pertussis (Tdap/Td) vaccine.** / Consult your health care provider. 1 dose of Td every 10 years.  Varicella vaccine.** / Consult your health care provider.  Zoster vaccine.** / 1 dose for adults aged 24 years or older.  Measles, mumps, rubella (MMR) vaccine.** / You need at least 1 dose of MMR if you were born in 1958 or later. You may also need a second dose.  Pneumococcal 13-valent conjugate (PCV13) vaccine.** / Consult your health care provider.  Pneumococcal polysaccharide (PPSV23) vaccine.** / 1 to 2 doses if you smoke cigarettes or if you have certain conditions.  Meningococcal vaccine.** / Consult your health care provider.  Hepatitis A vaccine.** / Consult your health care provider.  Hepatitis B vaccine.** / Consult your health care provider.  Haemophilus influenzae type b (Hib) vaccine.** / Consult your health care provider. Ages 69 and over  Blood pressure check.** / Every year.  Lipid and cholesterol check.**/ Every 5 years beginning at age 58.  Lung cancer screening. / Every year if you are aged 53-80 years and have a 30-pack-year history of smoking and currently smoke or have quit within the past 15 years. Yearly screening  is stopped once you have quit smoking for at least 15 years or develop a health problem that would prevent you from having lung cancer treatment.  Fecal occult blood test (FOBT) of stool. / Every year beginning at age 25 and continuing until age 50. You may not have to do this test if you get a colonoscopy every 10 years.  Flexible sigmoidoscopy** or colonoscopy.** / Every 5 years for a flexible sigmoidoscopy or every 10 years for a colonoscopy beginning at age 66 and continuing until age 63.  Hepatitis C blood test.** / For all people born from [birth year] through 1965 and any individual with known risks for hepatitis C.  Abdominal aortic aneurysm (AAA) screening.** / A one-time screening for ages 28 to 91 years who are current or former smokers.  Skin self-exam. / Monthly.  Influenza vaccine. / Every year.  Tetanus, diphtheria, and acellular pertussis (Tdap/Td) vaccine.** / 1 dose of Td every 10 years.  Varicella vaccine.** / Consult your health care provider.  Zoster vaccine.** / 1 dose for adults aged 38 years or older.  Pneumococcal 13-valent conjugate (PCV13) vaccine.** / 1 dose for all adults aged 67 years and older.  Pneumococcal polysaccharide (PPSV23) vaccine.** / 1 dose for all adults aged 54 years and older.  Meningococcal vaccine.** / Consult your health care provider.  Hepatitis A vaccine.** / Consult your health care provider.  Hepatitis B vaccine.** / Consult your health care provider.  Haemophilus influenzae type b (Hib) vaccine.** / Consult your health care provider. **Family history and personal history of risk and conditions may change your health care provider's recommendations.   This information is not intended to replace advice given to you by your  health care provider. Make sure you discuss any questions you have with your health care provider.   Document Released: 03/21/2001 Document Revised: 02/13/2014 Document Reviewed: 06/20/2010 Elsevier Interactive Patient  Education [employer].

## 2015-04-09 NOTE — Progress Notes (Signed)
Pre visit review using our clinic review tool, if applicable. No additional management support is needed unless otherwise documented below in the visit note. 

## 2015-04-10 LAB — COMPREHENSIVE METABOLIC PANEL
ALK PHOS: 65 U/L (ref 40–115)
ALT: 21 U/L (ref 9–46)
AST: 21 U/L (ref 10–35)
Albumin: 4.2 g/dL (ref 3.6–5.1)
BILIRUBIN TOTAL: 0.3 mg/dL (ref 0.2–1.2)
BUN: 20 mg/dL (ref 7–25)
CALCIUM: 9.6 mg/dL (ref 8.6–10.3)
CO2: 24 mmol/L (ref 20–31)
Chloride: 104 mmol/L (ref 98–110)
Creat: 0.94 mg/dL (ref 0.70–1.33)
GLUCOSE: 97 mg/dL (ref 65–99)
Potassium: 4.4 mmol/L (ref 3.5–5.3)
Sodium: 138 mmol/L (ref 135–146)
Total Protein: 6.5 g/dL (ref 6.1–8.1)

## 2015-04-10 LAB — CBC
HEMATOCRIT: 48 % (ref 39.0–52.0)
Hemoglobin: 16 g/dL (ref 13.0–17.0)
MCH: 27.6 pg (ref 26.0–34.0)
MCHC: 33.3 g/dL (ref 30.0–36.0)
MCV: 82.9 fL (ref 78.0–100.0)
MPV: 12.7 fL — AB (ref 8.6–12.4)
PLATELETS: 173 10*3/uL (ref 150–400)
RBC: 5.79 MIL/uL (ref 4.22–5.81)
RDW: 14.1 % (ref 11.5–15.5)
WBC: 9.1 10*3/uL (ref 4.0–10.5)

## 2015-04-10 LAB — LIPID PANEL
Cholesterol: 230 mg/dL — ABNORMAL HIGH (ref 125–200)
HDL: 33 mg/dL — ABNORMAL LOW (ref >=40)
Total CHOL/HDL Ratio: 7 Ratio — ABNORMAL HIGH (ref <=5.0)
Triglycerides: 416 mg/dL — ABNORMAL HIGH (ref <150)

## 2015-04-10 LAB — TSH: TSH: 0.99 mIU/L (ref 0.40–4.50)

## 2015-04-10 LAB — HEMOGLOBIN A1C
HEMOGLOBIN A1C: 5.8 % — AB (ref <5.7)
MEAN PLASMA GLUCOSE: 120 mg/dL — AB (ref <117)

## 2015-04-12 ENCOUNTER — Other Ambulatory Visit: Payer: Self-pay | Admitting: Family Medicine

## 2015-04-12 DIAGNOSIS — E785 Hyperlipidemia, unspecified: Secondary | ICD-10-CM

## 2015-04-16 MED FILL — VIIBRYD 20 MG TABLET: 20 | 30 days supply | Qty: 30 | Fill #4

## 2015-04-18 ENCOUNTER — Encounter: Payer: Self-pay | Admitting: Family Medicine

## 2015-04-18 DIAGNOSIS — R739 Hyperglycemia, unspecified: Secondary | ICD-10-CM | POA: Insufficient documentation

## 2015-04-18 DIAGNOSIS — M545 Low back pain, unspecified: Secondary | ICD-10-CM | POA: Insufficient documentation

## 2015-04-18 DIAGNOSIS — E782 Mixed hyperlipidemia: Secondary | ICD-10-CM

## 2015-04-18 DIAGNOSIS — Z0000 Encounter for general adult medical examination without abnormal findings: Secondary | ICD-10-CM | POA: Insufficient documentation

## 2015-04-18 DIAGNOSIS — B977 Papillomavirus as the cause of diseases classified elsewhere: Secondary | ICD-10-CM | POA: Insufficient documentation

## 2015-04-18 HISTORY — DX: Mixed hyperlipidemia: E78.2

## 2015-04-18 NOTE — Assessment & Plan Note (Signed)
Encouraged moist heat and gentle stretching as tolerated. May try NSAIDs and prescription meds as directed and report if symptoms worsen or seek immediate care, try topical treatments

## 2015-04-18 NOTE — Assessment & Plan Note (Signed)
Given rx for [place] treatments to use as directed, start Folic Acid supplements

## 2015-04-18 NOTE — Assessment & Plan Note (Addendum)
Patient encouraged to maintain heart healthy diet, regular exercise, adequate sleep. Consider daily probiotics. Take medications as prescribed. Labs reviewed. Referred for screening colonoscopy

## 2015-04-18 NOTE — Assessment & Plan Note (Signed)
Encouraged heart healthy diet, increase exercise, avoid trans fats, consider a krill oil cap daily 

## 2015-04-18 NOTE — Assessment & Plan Note (Signed)
hgba1c acceptable, minimize simple carbs. Increase exercise as tolerated.  

## 2015-04-18 NOTE — Progress Notes (Signed)
Subjective:    Patient ID: Karl Jacobs, male    DOB: 01/01/1966, 50 y.o.   MRN: [id number]  Chief Complaint  Patient presents with  . Annual Exam    HPI Patient is in today for annual exam. No recent illness but is struggling with some recurrent HPV lesions. They have been removed in past and are now itchy and irritated. Otherwise he feels well. Has had some low back pain and is following with Dr Danielle Dess, neurosurgery due to the severity of his symptoms. He has had a neurostimulator in the past. No incotinence or new radicular symptoms. Denies CP/palp/SOB/HA/congestion/fevers/GI or GU c/o. Taking meds as prescribed  Past Medical History  Diagnosis Date  . Post traumatic stress disorder (PTSD)     H/O .Marland Kitchen...  "FELT BETTER IN 2006"  . Chronic back pain   . Depression   . Hyperlipidemia, mixed 04/18/2015    Past Surgical History  Procedure Laterality Date  . Back surgery      X 3  . Tonsillectomy    . Appendectomy    . Spinal cord stimulator insertion  10/19/2011    Procedure: LUMBAR SPINAL CORD STIMULATOR INSERTION;  Surgeon: Venita Lick, MD;  Location: [facility] OR;  Service: Orthopedics;  Laterality: N/A;  SPINAL CORD STIMULATOR PLACEMENT  . Tonsillectomy    . Wisdom tooth extraction    . Vasectomy      & REVERSAL IN 2008    Family History  Problem Relation Age of Onset  . Other Mother     Paget's Disease   . Hypertension Father   . Diabetes Father     type 2  . Cancer Father     hodgkins lymphoma  . Hyperlipidemia Father   . Arthritis Father   . Other Paternal Grandfather     carbon monoxide  . Stroke Paternal Grandfather   . Alcohol abuse Brother   . Cancer Brother 48    colon ca, polyp  . Stroke Maternal Grandfather     ?  Marland Kitchen Pneumonia Paternal Grandmother   . Healthy Son 25  . Healthy Daughter 41  . Healthy Daughter 3    Social History   Social History  . Marital Status: Married    Spouse Name: N/A  . Number of Children: N/A  . Years of Education:  N/A   Occupational History  . Not on file.   Social History Main Topics  . Smoking status: Former Smoker -- 1.00 packs/day for 30 years    Types: Cigarettes    Quit date: 11/06/2012  . Smokeless tobacco: Never Used     Comment: Using Vapor Cigarette to quit  . Alcohol Use: No  . Drug Use: No  . Sexual Activity: Yes    Birth Control/ Protection: None     Comment: lives with wife, daughers, son  (when from college), no dietary restrictions   Other Topics Concern  . Not on file   Social History Narrative    Outpatient Prescriptions Prior to Visit  Medication Sig Dispense Refill  . buprenorphine-naloxone (SUBOXONE) 8-2 MG SUBL SL tablet Place 1 tablet under the tongue 3 (three) times daily.    . Vilazodone HCl (VIIBRYD) 20 MG TABS Take 20 mg by mouth daily.     No facility-administered medications prior to visit.    No Known Allergies  Review of Systems  Constitutional: Negative for fever, chills and malaise/fatigue.  HENT: Negative for congestion and hearing loss.   Eyes: Negative for discharge.  Respiratory: Negative for cough, sputum production and shortness of breath.   Cardiovascular: Negative for chest pain, palpitations and leg swelling.  Gastrointestinal: Negative for heartburn, nausea, vomiting, abdominal pain, diarrhea, constipation and blood in stool.  Genitourinary: Negative for dysuria, urgency, frequency and hematuria.  Musculoskeletal: Negative for myalgias and falls.  Skin: Positive for itching and rash.  Neurological: Negative for dizziness, sensory change, loss of consciousness, weakness and headaches.  Endo/Heme/Allergies: Negative for environmental allergies. Does not bruise/bleed easily.  Psychiatric/Behavioral: Negative for depression and suicidal ideas. The patient is not nervous/anxious and does not have insomnia.        Objective:    Physical Exam  Constitutional: He is oriented to person, place, and time. He appears well-developed and  well-nourished. No distress.  HENT:  Head: Normocephalic and atraumatic.  Eyes: Conjunctivae are normal.  Neck: Neck supple. No thyromegaly present.  Cardiovascular: Normal rate, regular rhythm and normal heart sounds.   No murmur heard. Pulmonary/Chest: Effort normal and breath sounds normal. No respiratory distress. He has no wheezes.  Abdominal: Soft. Bowel sounds are normal. He exhibits no mass. There is no tenderness.  Musculoskeletal: He exhibits no edema.  Lymphadenopathy:    He has no cervical adenopathy.  Neurological: He is alert and oriented to person, place, and time.  Skin: Skin is warm and dry.  Psychiatric: He has a normal mood and affect. His behavior is normal.    BP 122/68 mmHg  Pulse 73  Temp(Src) 98.5 F (36.9 C) (Oral)  Ht 6' (1.829 m)  Wt 207 lb (93.895 kg)  BMI 28.07 kg/m2  SpO2 95% Wt Readings from Last 3 Encounters:  04/09/15 207 lb (93.895 kg)  07/27/14 207 lb 6 oz (94.065 kg)  01/21/14 208 lb 4 oz (94.462 kg)     Lab Results  Component Value Date   WBC 9.1 04/09/2015   HGB 16.0 04/09/2015   HCT 48.0 04/09/2015   PLT 173 04/09/2015   GLUCOSE 97 04/09/2015   CHOL 230* 04/09/2015   TRIG 416* 04/09/2015   HDL 33* 04/09/2015   LDLCALC NOT CALC 04/09/2015   ALT 21 04/09/2015   AST 21 04/09/2015   NA 138 04/09/2015   K 4.4 04/09/2015   CL 104 04/09/2015   CREATININE 0.94 04/09/2015   BUN 20 04/09/2015   CO2 24 04/09/2015   TSH 0.99 04/09/2015   HGBA1C 5.8* 04/09/2015    Lab Results  Component Value Date   TSH 0.99 04/09/2015   Lab Results  Component Value Date   WBC 9.1 04/09/2015   HGB 16.0 04/09/2015   HCT 48.0 04/09/2015   MCV 82.9 04/09/2015   PLT 173 04/09/2015   Lab Results  Component Value Date   NA 138 04/09/2015   K 4.4 04/09/2015   CO2 24 04/09/2015   GLUCOSE 97 04/09/2015   BUN 20 04/09/2015   CREATININE 0.94 04/09/2015   BILITOT 0.3 04/09/2015   ALKPHOS 65 04/09/2015   AST 21 04/09/2015   ALT 21 04/09/2015     PROT 6.5 04/09/2015   ALBUMIN 4.2 04/09/2015   CALCIUM 9.6 04/09/2015   Lab Results  Component Value Date   CHOL 230* 04/09/2015   Lab Results  Component Value Date   HDL 33* 04/09/2015   Lab Results  Component Value Date   LDLCALC NOT CALC 04/09/2015   Lab Results  Component Value Date   TRIG 416* 04/09/2015   Lab Results  Component Value Date   CHOLHDL 7.0* 04/09/2015  Lab Results  Component Value Date   HGBA1C 5.8* 04/09/2015       Assessment & Plan:   Problem List Items Addressed This Visit    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 Increase exercise as tolerated.       Relevant Orders   Hemoglobin A1c (Completed)   TSH   CBC   Hemoglobin A1c   Lipid panel   Comprehensive metabolic panel   Hyperlipidemia, mixed    Encouraged heart healthy diet, increase exercise, avoid trans fats, consider a krill oil cap daily      Low back pain   Relevant Orders   DG Lumbar Spine Complete (Completed)   TSH   CBC   Hemoglobin A1c   Lipid panel   Comprehensive metabolic panel   Lumbar back pain with radiculopathy affecting right lower extremity    Encouraged moist heat and gentle stretching as tolerated. May try NSAIDs and prescription meds as directed and report if symptoms worsen or seek immediate care, try topical treatments      Relevant Medications   lurasidone (LATUDA) 40 MG TABS tablet   Preventative health care    Patient encouraged to maintain heart healthy diet, regular exercise, adequate sleep. Consider daily probiotics. Take medications as prescribed. Labs reviewed. Referred for screening colonoscopy      Relevant Orders   TSH (Completed)   CBC (Completed)    Comprehensive metabolic panel (Completed)   Lipid panel (Completed)   TSH   CBC   Hemoglobin A1c   Lipid panel   Comprehensive metabolic panel    Other Visit Diagnoses    Colon cancer screening        Relevant Orders    Ambulatory referral to Gastroenterology    TSH    CBC    Hemoglobin A1c    Lipid panel    Comprehensive metabolic panel       I am having Mr. Pryce start on imiquimod. I am also having him maintain his buprenorphine-naloxone, Vilazodone HCl, and lurasidone.  Meds ordered this encounter  Medications  . lurasidone (LATUDA) 40 MG TABS tablet    Sig: Take 40 mg by mouth daily.  . imiquimod (ALDARA) 5 % cream    Sig: Apply topically 3 (three) times a week.    Dispense:  12 each    Refill:  0     Danise Edge, MD

## 2015-04-20 DIAGNOSIS — R52 Pain, unspecified: Secondary | ICD-10-CM | POA: Diagnosis not present

## 2015-04-20 DIAGNOSIS — F1121 Opioid dependence, in remission: Secondary | ICD-10-CM | POA: Diagnosis not present

## 2015-04-20 DIAGNOSIS — F4312 Post-traumatic stress disorder, chronic: Secondary | ICD-10-CM | POA: Diagnosis not present

## 2015-04-20 DIAGNOSIS — F332 Major depressive disorder, recurrent severe without psychotic features: Secondary | ICD-10-CM | POA: Diagnosis not present

## 2015-04-22 DIAGNOSIS — M545 Low back pain: Secondary | ICD-10-CM | POA: Diagnosis not present

## 2015-04-30 ENCOUNTER — Encounter: Payer: BLUE CROSS/BLUE SHIELD | Admitting: Family Medicine

## 2015-05-03 ENCOUNTER — Other Ambulatory Visit: Payer: Self-pay | Admitting: Family Medicine

## 2015-05-03 MED FILL — IMIQUIMOD 5% CREAM PACKET: 5 | 28 days supply | Qty: 12 | Fill #0

## 2015-05-03 MED FILL — LATUDA 40 MG TABLET: 40 | 30 days supply | Qty: 30 | Fill #0

## 2015-05-03 MED FILL — SUBOXONE 8 MG-2 MG SL FILM: 8-2 | 30 days supply | Qty: 90 | Fill #0

## 2015-05-20 MED FILL — VIIBRYD 20 MG TABLET: 20 | 30 days supply | Qty: 30 | Fill #5

## 2015-05-31 MED FILL — LATUDA 40 MG TABLET: 40 | 30 days supply | Qty: 30 | Fill #1

## 2015-06-03 MED FILL — SUBOXONE 8 MG-2 MG SL FILM: 8-2 | 30 days supply | Qty: 90 | Fill #0

## 2015-06-10 DIAGNOSIS — F1121 Opioid dependence, in remission: Secondary | ICD-10-CM | POA: Diagnosis not present

## 2015-06-10 DIAGNOSIS — F4312 Post-traumatic stress disorder, chronic: Secondary | ICD-10-CM | POA: Diagnosis not present

## 2015-06-10 DIAGNOSIS — F1021 Alcohol dependence, in remission: Secondary | ICD-10-CM | POA: Diagnosis not present

## 2015-06-10 DIAGNOSIS — F332 Major depressive disorder, recurrent severe without psychotic features: Secondary | ICD-10-CM | POA: Diagnosis not present

## 2015-06-21 MED FILL — VIAGRA 100 MG TABLET: 100 | 30 days supply | Qty: 6 | Fill #0

## 2015-06-21 MED FILL — VIIBRYD 20 MG TABLET: 20 | 30 days supply | Qty: 30 | Fill #0

## 2015-06-23 ENCOUNTER — Other Ambulatory Visit: Payer: Self-pay | Admitting: Family Medicine

## 2015-06-23 MED FILL — IMIQUIMOD 5% CREAM PACKET: 5 | 30 days supply | Qty: 12 | Fill #0

## 2015-07-02 MED FILL — LATUDA 40 MG TABLET: 40 | 30 days supply | Qty: 30 | Fill #2

## 2015-07-02 MED FILL — SUBOXONE 8 MG-2 MG SL FILM: 8-2 | 30 days supply | Qty: 90 | Fill #0

## 2015-07-13 ENCOUNTER — Other Ambulatory Visit: Payer: 59

## 2015-07-21 MED FILL — VIIBRYD 20 MG TABLET: 20 | 30 days supply | Qty: 30 | Fill #1

## 2015-07-30 MED FILL — VIAGRA 100 MG TABLET: 100 | 30 days supply | Qty: 6 | Fill #1

## 2015-08-02 MED FILL — SUBOXONE 8 MG-2 MG SL FILM: 8-2 | 30 days supply | Qty: 90 | Fill #0

## 2015-08-02 MED FILL — LATUDA 40 MG TABLET: 40 | 30 days supply | Qty: 30 | Fill #0

## 2015-08-19 MED FILL — VIIBRYD 20 MG TABLET: 20 | 30 days supply | Qty: 30 | Fill #2

## 2015-08-30 MED FILL — VIAGRA 100 MG TABLET: 100 | 30 days supply | Qty: 6 | Fill #2

## 2015-09-02 DIAGNOSIS — F4312 Post-traumatic stress disorder, chronic: Secondary | ICD-10-CM | POA: Diagnosis not present

## 2015-09-02 DIAGNOSIS — F332 Major depressive disorder, recurrent severe without psychotic features: Secondary | ICD-10-CM | POA: Diagnosis not present

## 2015-09-02 DIAGNOSIS — F1121 Opioid dependence, in remission: Secondary | ICD-10-CM | POA: Diagnosis not present

## 2015-09-06 MED FILL — LATUDA 40 MG TABLET: 40 | 30 days supply | Qty: 30 | Fill #1

## 2015-09-07 MED FILL — SUBOXONE 8 MG-2 MG SL FILM: 8-2 | 30 days supply | Qty: 90 | Fill #0

## 2015-09-24 MED FILL — VIIBRYD 20 MG TABLET: 20 | 30 days supply | Qty: 30 | Fill #0

## 2015-10-05 DIAGNOSIS — F332 Major depressive disorder, recurrent severe without psychotic features: Secondary | ICD-10-CM | POA: Diagnosis not present

## 2015-10-05 DIAGNOSIS — F4312 Post-traumatic stress disorder, chronic: Secondary | ICD-10-CM | POA: Diagnosis not present

## 2015-10-05 DIAGNOSIS — F1121 Opioid dependence, in remission: Secondary | ICD-10-CM | POA: Diagnosis not present

## 2015-10-05 DIAGNOSIS — G8929 Other chronic pain: Secondary | ICD-10-CM | POA: Diagnosis not present

## 2015-10-05 MED FILL — LATUDA 40 MG TABLET: 40 | 30 days supply | Qty: 30 | Fill #2

## 2015-10-15 MED FILL — SUBOXONE 8 MG-2 MG SL FILM: 8-2 | 30 days supply | Qty: 90 | Fill #0

## 2015-10-18 MED FILL — VIAGRA 100 MG TABLET: 100 | 30 days supply | Qty: 6 | Fill #3

## 2015-10-29 MED FILL — VIIBRYD 20 MG TABLET: 20 | 30 days supply | Qty: 30 | Fill #1

## 2015-11-02 DIAGNOSIS — F1121 Opioid dependence, in remission: Secondary | ICD-10-CM | POA: Diagnosis not present

## 2015-11-02 DIAGNOSIS — F332 Major depressive disorder, recurrent severe without psychotic features: Secondary | ICD-10-CM | POA: Diagnosis not present

## 2015-11-02 DIAGNOSIS — F4312 Post-traumatic stress disorder, chronic: Secondary | ICD-10-CM | POA: Diagnosis not present

## 2015-11-08 MED FILL — LATUDA 40 MG TABLET: 40 | 30 days supply | Qty: 30 | Fill #3

## 2015-11-16 MED FILL — SUBOXONE 8 MG-2 MG SL FILM: 8-2 | 30 days supply | Qty: 90 | Fill #0

## 2015-11-22 ENCOUNTER — Ambulatory Visit (INDEPENDENT_AMBULATORY_CARE_PROVIDER_SITE_OTHER): Payer: 59 | Admitting: Family

## 2015-11-22 ENCOUNTER — Encounter: Payer: Self-pay | Admitting: Family

## 2015-11-22 VITALS — BP 117/78 | HR 66 | Temp 98.5°F | Resp 18 | Ht 72.0 in | Wt 213.4 lb

## 2015-11-22 DIAGNOSIS — G500 Trigeminal neuralgia: Secondary | ICD-10-CM | POA: Diagnosis not present

## 2015-11-22 DIAGNOSIS — M542 Cervicalgia: Secondary | ICD-10-CM | POA: Diagnosis not present

## 2015-11-22 MED ORDER — MELOXICAM 7.5 MG PO TABS: 7.5000 mg | ORAL_TABLET | Freq: Every day | ORAL | 0 refills | Status: DC

## 2015-11-22 MED ORDER — GABAPENTIN 100 MG PO CAPS: 100.0000 mg | ORAL_CAPSULE | Freq: Three times a day (TID) | ORAL | 1 refills | Status: DC

## 2015-11-22 MED FILL — GABAPENTIN 100 MG CAPSULE: 100 | 30 days supply | Qty: 90 | Fill #0

## 2015-11-22 MED FILL — MELOXICAM 7.5 MG TABLET: 7.5 | 14 days supply | Qty: 14 | Fill #0

## 2015-11-22 NOTE — Patient Instructions (Signed)
Please begin meloxicam (anti-inflammatory). Begin gabapentin three times daily. Call if new/worsening symptoms or if symptoms are not improved in 1 week.

## 2015-11-22 NOTE — Progress Notes (Signed)
Pre visit review using our clinic review tool, if applicable. No additional management support is needed unless otherwise documented below in the visit note. 

## 2015-11-22 NOTE — Progress Notes (Signed)
Subjective:    Patient ID: Karl Jacobs, male    DOB: 01-Sep-1965, 50 y.o.   MRN: [id number]  HPI  Mr. Sweda is a 50 yr old male who presents today with chief complaint of neck pain. Pain began on 10/13. Had CT in 11/2012 with evidence of severe disc disease.  Patient has a permanent spinal stimulator.  Reports some pain down the right side of his neck.  Has some tenderness at the base of his neck. Hurts to touch the hair gently on the top of his head. Denies numbness/weakness.  Has a dull pain on the top of his head.  Can become sharp when he touches his hair. No facial pain. He has tried aleve,  ibuprofen and tylenol. No significant improvement .    Review of Systems See HPI  Past Medical History:  Diagnosis Date  . Chronic back pain   . Depression   . Hyperlipidemia, mixed 04/18/2015  . Post traumatic stress disorder (PTSD)    H/O .Marland Kitchen...  "FELT BETTER IN 2006"     Social History   Social History  . Marital status: Married    Spouse name: N/A  . Number of children: N/A  . Years of education: N/A   Occupational History  . Not on file.   Social History Main Topics  . Smoking status: Former Smoker    Packs/day: 1.00    Years: 30.00    Types: Cigarettes    Quit date: 11/06/2012  . Smokeless tobacco: Never Used     Comment: Using Vapor Cigarette to quit  . Alcohol use No  . Drug use: No  . Sexual activity: Yes    Birth control/ protection: None     Comment: lives with wife, daughers, son  (when from college), no dietary restrictions   Other Topics Concern  . Not on file   Social History Narrative  . No narrative on file    Past Surgical History:  Procedure Laterality Date  . APPENDECTOMY    . BACK SURGERY     X 3  . SPINAL CORD STIMULATOR INSERTION  10/19/2011   Procedure: LUMBAR SPINAL CORD STIMULATOR INSERTION;  Surgeon: Venita Lick, MD;  Location: [facility] OR;  Service: Orthopedics;  Laterality: N/A;  SPINAL CORD STIMULATOR PLACEMENT  . TONSILLECTOMY    .  TONSILLECTOMY    . VASECTOMY     & REVERSAL IN 2008  . WISDOM TOOTH EXTRACTION      Family History  Problem Relation Age of Onset  . Other Mother     Paget's Disease   . Hypertension Father   . Diabetes Father     type 2  . Cancer Father     hodgkins lymphoma  . Hyperlipidemia Father   . Arthritis Father   . Other Paternal Grandfather     carbon monoxide  . Stroke Paternal Grandfather   . Alcohol abuse Brother   . Cancer Brother 48    colon ca, polyp  . Stroke Maternal Grandfather     ?  Marland Kitchen Pneumonia Paternal Grandmother   . Healthy Son 68  . Healthy Daughter 41  . Healthy Daughter 3    No Known Allergies  Current Outpatient Prescriptions on File Prior to Visit  Medication Sig Dispense Refill  . buprenorphine-naloxone (SUBOXONE) 8-2 MG SUBL SL tablet Place 1 tablet under the tongue 3 (three) times daily.    . imiquimod (ALDARA) 5 % cream APPLY TOPICALLY 3 TIMES A WEEK. 12 each 0  .  lurasidone (LATUDA) 40 MG TABS tablet Take 40 mg by mouth daily.    . Vilazodone HCl (VIIBRYD) 20 MG TABS Take 20 mg by mouth daily.     No current facility-administered medications on file prior to visit.     BP 117/78 (BP Location: Right Arm, Patient Position: Sitting, Cuff Size: Normal)   Pulse 66   Temp 98.5 F (36.9 C) (Oral)   Resp 18   Ht 6' (1.829 m)   Wt 213 lb 6.4 oz (96.8 kg)   SpO2 99% Comment: room air  BMI 28.94 kg/m       Objective:   Physical Exam  Constitutional: He is oriented to person, place, and time. He appears well-developed and well-nourished. No distress.  HENT:  Head: Normocephalic and atraumatic.  Cardiovascular: Normal rate and regular rhythm.   No murmur heard. Pulmonary/Chest: Effort normal and breath sounds normal. No respiratory distress. He has no wheezes. He has no rales.  Musculoskeletal: He exhibits no edema.  Neurological: He is alert and oriented to person, place, and time.  Skin: Skin is warm and dry.  Psychiatric: He has a normal mood  and affect. His behavior is normal. Thought content normal.          Assessment & Plan:  Trigeminal neuralgia- suspect variant of trigeminal neuralgia. Will give trial of gabapentin.   Musculoskeletal neck pain- trial of meloxicam.

## 2015-11-30 MED FILL — VIIBRYD 20 MG TABLET: 20 | 30 days supply | Qty: 30 | Fill #2

## 2015-12-06 MED FILL — VIAGRA 100 MG TABLET: 100 | 30 days supply | Qty: 6 | Fill #4

## 2015-12-06 MED FILL — LATUDA 40 MG TABLET: 40 | 30 days supply | Qty: 30 | Fill #4

## 2015-12-07 DIAGNOSIS — M5481 Occipital neuralgia: Secondary | ICD-10-CM | POA: Diagnosis not present

## 2015-12-07 DIAGNOSIS — F332 Major depressive disorder, recurrent severe without psychotic features: Secondary | ICD-10-CM | POA: Diagnosis not present

## 2015-12-07 DIAGNOSIS — F1121 Opioid dependence, in remission: Secondary | ICD-10-CM | POA: Diagnosis not present

## 2015-12-07 DIAGNOSIS — F4312 Post-traumatic stress disorder, chronic: Secondary | ICD-10-CM | POA: Diagnosis not present

## 2015-12-09 MED FILL — GABAPENTIN 400 MG CAPSULE: 400 | 30 days supply | Qty: 90 | Fill #0

## 2015-12-09 MED FILL — SUBOXONE 8 MG-2 MG SL FILM: 8-2 | 30 days supply | Qty: 90 | Fill #0

## 2015-12-14 ENCOUNTER — Ambulatory Visit: Payer: 59 | Admitting: Family

## 2016-01-04 DIAGNOSIS — M5481 Occipital neuralgia: Secondary | ICD-10-CM | POA: Diagnosis not present

## 2016-01-04 DIAGNOSIS — F4312 Post-traumatic stress disorder, chronic: Secondary | ICD-10-CM | POA: Diagnosis not present

## 2016-01-04 DIAGNOSIS — F1121 Opioid dependence, in remission: Secondary | ICD-10-CM | POA: Diagnosis not present

## 2016-01-04 DIAGNOSIS — F332 Major depressive disorder, recurrent severe without psychotic features: Secondary | ICD-10-CM | POA: Diagnosis not present

## 2016-01-11 MED FILL — SUBOXONE 8 MG-2 MG SL FILM: 8-2 | 30 days supply | Qty: 90 | Fill #0

## 2016-01-14 MED FILL — VIIBRYD 20 MG TABLET: 20 | 30 days supply | Qty: 30 | Fill #3

## 2016-02-03 ENCOUNTER — Encounter: Payer: Self-pay | Admitting: Family Medicine

## 2016-02-03 ENCOUNTER — Ambulatory Visit (INDEPENDENT_AMBULATORY_CARE_PROVIDER_SITE_OTHER): Payer: 59 | Admitting: Family Medicine

## 2016-02-03 VITALS — BP 116/72 | HR 66 | Temp 98.2°F | Wt 224.0 lb

## 2016-02-03 DIAGNOSIS — R739 Hyperglycemia, unspecified: Secondary | ICD-10-CM | POA: Diagnosis not present

## 2016-02-03 DIAGNOSIS — G479 Sleep disorder, unspecified: Secondary | ICD-10-CM

## 2016-02-03 DIAGNOSIS — R0683 Snoring: Secondary | ICD-10-CM | POA: Diagnosis not present

## 2016-02-03 DIAGNOSIS — R0681 Apnea, not elsewhere classified: Secondary | ICD-10-CM | POA: Diagnosis not present

## 2016-02-03 DIAGNOSIS — E782 Mixed hyperlipidemia: Secondary | ICD-10-CM

## 2016-02-03 NOTE — Progress Notes (Signed)
Patient ID: Karl Jacobs, male   DOB: 1965-05-10, 50 y.o.   MRN: [id number]

## 2016-02-03 NOTE — Progress Notes (Signed)
Pre visit review using our clinic review tool, if applicable. No additional management support is needed unless otherwise documented below in the visit note. 

## 2016-02-03 NOTE — Progress Notes (Signed)
Subjective:    Patient ID: Karl Jacobs, male    DOB: 1965-06-09, 50 y.o.   MRN: [id number]  Chief Complaint  Patient presents with  . Amb Ref for Sleep Study    Wants a CPAP.    HPI Patient is in today for referral for sleep study. His wife endorses witnessed episodes of apnea at night, the patient wakes up frequently. He often notes headaches and he struggles with excessive fatigue. Denies polyuria or polydipsia.Denies CP/palp/SOB/congestion/fevers/GI or GU c/o. Taking meds as prescribed  Past Medical History:  Diagnosis Date  . Chronic back pain   . Depression   . Hyperlipidemia, mixed 04/18/2015  . Post traumatic stress disorder (PTSD)    H/O .Marland Kitchen....  "FELT BETTER IN 2006"    Past Surgical History:  Procedure Laterality Date  . APPENDECTOMY    . BACK SURGERY     X 3  . SPINAL CORD STIMULATOR INSERTION  10/19/2011   Procedure: LUMBAR SPINAL CORD STIMULATOR INSERTION;  Surgeon: Venita Lickahari Brooks, MD;  Location: [facility] OR;  Service: Orthopedics;  Laterality: N/A;  SPINAL CORD STIMULATOR PLACEMENT  . TONSILLECTOMY    . TONSILLECTOMY    . VASECTOMY     & REVERSAL IN 2008  . WISDOM TOOTH EXTRACTION      Family History  Problem Relation Age of Onset  . Other Mother     Paget's Disease   . Hypertension Father   . Diabetes Father     type 2  . Cancer Father     hodgkins lymphoma  . Hyperlipidemia Father   . Arthritis Father   . Other Paternal Grandfather     carbon monoxide  . Stroke Paternal Grandfather   . Alcohol abuse Brother   . Cancer Brother 48    colon ca, polyp  . Stroke Maternal Grandfather     ?  Marland Kitchen. Pneumonia Paternal Grandmother   . Healthy Son [id number]  . Healthy Daughter 723  . Healthy Daughter 3    Social History   Social History  . Marital status: Married    Spouse name: N/A  . Number of children: N/A  . Years of education: N/A   Occupational History  . Not on file.   Social History Main Topics  . Smoking status: Former Smoker    Packs/day: 1.00      Years: 30.00    Types: Cigarettes    Quit date: 11/06/2012  . Smokeless tobacco: Never Used     Comment: Using Vapor Cigarette to quit  . Alcohol use No  . Drug use: No  . Sexual activity: Yes    Birth control/ protection: None     Comment: lives with wife, daughers, son  (when from college), no dietary restrictions   Other Topics Concern  . Not on file   Social History Narrative  . No narrative on file    Outpatient Medications Prior to Visit  Medication Sig Dispense Refill  . buprenorphine-naloxone (SUBOXONE) 8-2 MG SUBL SL tablet Place 1 tablet under the tongue 3 (three) times daily.    Marland Kitchen. lurasidone (LATUDA) 40 MG TABS tablet Take 40 mg by mouth daily.    Marland Kitchen. VIAGRA 100 MG tablet Take 100 mg by mouth as needed.  11  . gabapentin (NEURONTIN) 100 MG capsule Take 1 capsule (100 mg total) by mouth 3 (three) times daily. (Patient not taking: Reported on 02/03/2016) 90 capsule 1  . imiquimod (ALDARA) 5 % cream APPLY TOPICALLY 3 TIMES A WEEK. (Patient  not taking: Reported on 02/03/2016) 12 each 0  . meloxicam (MOBIC) 7.5 MG tablet Take 1 tablet (7.5 mg total) by mouth daily. (Patient not taking: Reported on 02/03/2016) 14 tablet 0  . Vilazodone HCl (VIIBRYD) 20 MG TABS Take 20 mg by mouth daily.     No facility-administered medications prior to visit.     No Known Allergies  Review of Systems  Constitutional: Positive for malaise/fatigue. Negative for fever.  Eyes: Negative for blurred vision.  Respiratory: Negative for cough and shortness of breath.   Cardiovascular: Negative for chest pain.  Gastrointestinal: Negative for vomiting.  Musculoskeletal: Negative for back pain.  Skin: Negative for rash.  Neurological: Positive for headaches. Negative for loss of consciousness.  Psychiatric/Behavioral: The patient has insomnia.        Objective:    Physical Exam  Constitutional: He is oriented to person, place, and time. He appears well-developed and well-nourished. No  distress.  HENT:  Head: Normocephalic and atraumatic.  Eyes: Conjunctivae are normal.  Neck: Normal range of motion. No thyromegaly present.  Cardiovascular: Normal rate and regular rhythm.   Pulmonary/Chest: Effort normal and breath sounds normal. He has no wheezes.  Abdominal: Soft. Bowel sounds are normal. There is no tenderness.  Musculoskeletal: He exhibits no edema or deformity.  Neurological: He is alert and oriented to person, place, and time.  Skin: Skin is warm and dry. He is not diaphoretic.  Psychiatric: He has a normal mood and affect.    BP 116/72 (BP Location: Left Arm, Patient Position: Sitting, Cuff Size: Large)   Pulse 66   Temp 98.2 F (36.8 C) (Oral)   Wt 224 lb (101.6 kg)   SpO2 94% Comment: RA  BMI 30.38 kg/m  Wt Readings from Last 3 Encounters:  02/03/16 224 lb (101.6 kg)  11/22/15 213 lb 6.4 oz (96.8 kg)  04/09/15 207 lb (93.9 kg)     Lab Results  Component Value Date   WBC 9.1 04/09/2015   HGB 16.0 04/09/2015   HCT 48.0 04/09/2015   PLT 173 04/09/2015   GLUCOSE 97 04/09/2015   CHOL 230 (H) 04/09/2015   TRIG 416 (H) 04/09/2015   HDL 33 (L) 04/09/2015   LDLCALC NOT CALC 04/09/2015   ALT 21 04/09/2015   AST 21 04/09/2015   NA 138 04/09/2015   K 4.4 04/09/2015   CL 104 04/09/2015   CREATININE 0.94 04/09/2015   BUN 20 04/09/2015   CO2 24 04/09/2015   TSH 0.99 04/09/2015   HGBA1C 5.8 (H) 04/09/2015    Lab Results  Component Value Date   TSH 0.99 04/09/2015   Lab Results  Component Value Date   WBC 9.1 04/09/2015   HGB 16.0 04/09/2015   HCT 48.0 04/09/2015   MCV 82.9 04/09/2015   PLT 173 04/09/2015   Lab Results  Component Value Date   NA 138 04/09/2015   K 4.4 04/09/2015   CO2 24 04/09/2015   GLUCOSE 97 04/09/2015   BUN 20 04/09/2015   CREATININE 0.94 04/09/2015   BILITOT 0.3 04/09/2015   ALKPHOS 65 04/09/2015   AST 21 04/09/2015   ALT 21 04/09/2015   PROT 6.5 04/09/2015   ALBUMIN 4.2 04/09/2015   CALCIUM 9.6 04/09/2015     Lab Results  Component Value Date   CHOL 230 (H) 04/09/2015   Lab Results  Component Value Date   HDL 33 (L) 04/09/2015   Lab Results  Component Value Date   LDLCALC NOT CALC 04/09/2015  Lab Results  Component Value Date   TRIG 416 (H) 04/09/2015   Lab Results  Component Value Date   CHOLHDL 7.0 (H) 04/09/2015   Lab Results  Component Value Date   HGBA1C 5.8 (H) 04/09/2015      I acted as a Neurosurgeon for Dr. Abner Greenspan. [place], [place]  Assessment & Plan:   Problem List Items Addressed This Visit    Hyperglycemia     minimize simple carbs. Increase exercise as tolerated.      Hyperlipidemia, mixed    Encouraged heart healthy diet, increase exercise, avoid trans fats, consider a krill oil cap daily      Witnessed episode of apnea - Primary    Reported by patient's wife, he endorses restless sleep, am headaches and excessive fatigue. He is referred to pulmonology for sleep study      Relevant Orders   Ambulatory referral to Pulmonology    Other Visit Diagnoses    Snoring       Relevant Orders   Ambulatory referral to Pulmonology   Restless sleeper       Relevant Orders   Ambulatory referral to Pulmonology      I am having Mr. Nuttall maintain his buprenorphine-naloxone, Vilazodone HCl, lurasidone, imiquimod, VIAGRA, gabapentin, and meloxicam.  No orders of the defined types were placed in this encounter.  CMA served as Neurosurgeon during this visit. History, Physical and Plan performed by medical provider. Documentation and orders reviewed and attested to.  Danise Edge, MD

## 2016-02-03 NOTE — Patient Instructions (Signed)

## 2016-02-09 DIAGNOSIS — F1121 Opioid dependence, in remission: Secondary | ICD-10-CM | POA: Diagnosis not present

## 2016-02-09 DIAGNOSIS — F332 Major depressive disorder, recurrent severe without psychotic features: Secondary | ICD-10-CM | POA: Diagnosis not present

## 2016-02-09 DIAGNOSIS — R0681 Apnea, not elsewhere classified: Secondary | ICD-10-CM | POA: Insufficient documentation

## 2016-02-09 DIAGNOSIS — F4312 Post-traumatic stress disorder, chronic: Secondary | ICD-10-CM | POA: Diagnosis not present

## 2016-02-09 NOTE — Assessment & Plan Note (Signed)
Encouraged heart healthy diet, increase exercise, avoid trans fats, consider a krill oil cap daily 

## 2016-02-09 NOTE — Assessment & Plan Note (Signed)
Reported by patient's wife, he endorses restless sleep, am headaches and excessive fatigue. He is referred to pulmonology for sleep study

## 2016-02-09 NOTE — Assessment & Plan Note (Signed)
minimize simple carbs. Increase exercise as tolerated.  

## 2016-02-10 ENCOUNTER — Encounter: Payer: Self-pay | Admitting: Pulmonary Disease

## 2016-02-10 ENCOUNTER — Ambulatory Visit (INDEPENDENT_AMBULATORY_CARE_PROVIDER_SITE_OTHER): Payer: 59 | Admitting: Pulmonary Disease

## 2016-02-10 VITALS — BP 106/72 | HR 68 | Ht 72.0 in | Wt 222.6 lb

## 2016-02-10 DIAGNOSIS — R0683 Snoring: Secondary | ICD-10-CM

## 2016-02-10 NOTE — Progress Notes (Signed)
Past Surgical History He  has a past surgical history that includes Back surgery; Tonsillectomy; Appendectomy; Spinal cord stimulator insertion (10/19/2011); Tonsillectomy; Wisdom tooth extraction; and Vasectomy.  No Known Allergies  Family History His family history includes Alcohol abuse in his brother; Arthritis in his father; Cancer in his father; Cancer (age of onset: 12) in his brother; Diabetes in his father; Healthy (age of onset: 22) in his son; Healthy (age of onset: 34) in his daughter; Healthy (age of onset: 3) in his daughter; Hyperlipidemia in his father; Hypertension in his father; Other in his mother and paternal grandfather; Pneumonia in his paternal grandmother; Stroke in his maternal grandfather and paternal grandfather.  Social History He  reports that he has been smoking Cigarettes.  He has a 33.00 pack-year smoking history. He has never used smokeless tobacco. He reports that he does not drink alcohol or use drugs.  Review of systems Constitutional: Negative for fever and unexpected weight change.  HENT: Negative for congestion, dental problem, ear pain, nosebleeds, postnasal drip, rhinorrhea, sinus pressure, sneezing, sore throat and trouble swallowing.   Eyes: Negative for redness and itching.  Respiratory: Negative for cough, chest tightness, shortness of breath and wheezing.   Cardiovascular: Negative for palpitations and leg swelling.  Gastrointestinal: Negative for nausea and vomiting.  Genitourinary: Negative for dysuria.  Musculoskeletal: Negative for joint swelling.  Skin: Negative for rash.  Neurological: Negative for headaches.  Hematological: Does not bruise/bleed easily.  Psychiatric/Behavioral: Negative for dysphoric mood. The patient is not nervous/anxious.     Current Outpatient Prescriptions on File Prior to Visit  Medication Sig  . buprenorphine-naloxone (SUBOXONE) 8-2 MG SUBL SL tablet Place 1 tablet under the tongue 3 (three) times daily.  Marland Kitchen  VIAGRA 100 MG tablet Take 100 mg by mouth as needed.  . Vilazodone HCl (VIIBRYD) 20 MG TABS Take 20 mg by mouth daily.   No current facility-administered medications on file prior to visit.     Chief Complaint  Patient presents with  . SLEEP CONSULT    Referred by Dr Rogelia Rohrer for snoring. Epworth Score: 19    Past medical history He  has a past medical history of Chronic back pain; Depression; Hyperlipidemia, mixed (04/18/2015); and Post traumatic stress disorder (PTSD).  Vital signs BP 106/72 (BP Location: Left Arm, Cuff Size: Normal)   Pulse 68   Ht 6' (1.829 m)   Wt 222 lb 9.6 oz (101 kg)   SpO2 94%   BMI 30.19 kg/m   History of Present Illness Karl Jacobs is a 51 y.o. male for evaluation of sleep problems.  His wife is a Teacher, early years/pre.  She has been concerned about his sleep and snoring.  She has told him he stops breathing while asleep.  He will wake up frequently and feels like his mouth is shut.  He has noticed feeling more tired during the day, and has a hard time stay awake at work when using a computer.  He goes to sleep at 10 pm.  He falls asleep after 10 minutes.  He wakes up 1 or 2 times to use the bathroom.  He gets out of bed at 4 am.  He feels tired in the morning.  He denies morning headache.  He does not use anything to help him fall sleep.  He drinks 4 cups of coffee per day.  He will talk in his sleep.  He denies sleep walking, bruxism, or nightmares.  There is no history of restless legs.  He denies sleep hallucinations, sleep paralysis, or cataplexy.  The Epworth score is 19 out of 24.   Physical Exam:  General - No distress ENT - No sinus tenderness, no oral exudate, no LAN, no thyromegaly, TM clear, pupils equal/reactive, MP 3, high arched palate Cardiac - s1s2 regular, no murmur, pulses symmetric Chest - No wheeze/rales/dullness, good air entry, normal respiratory excursion Back - No focal tenderness Abd - Soft, non-tender, no organomegaly, + bowel  sounds Ext - No edema Neuro - Normal strength, cranial nerves intact Skin - No rashes Psych - Normal mood, and behavior  Discussion: He has snoring, witnessed apnea, sleep disruption and daytime sleepiness.  I am concerned he could have sleep apnea.  We discussed how sleep apnea can affect various health problems, including risks for hypertension, cardiovascular disease, and diabetes.  We also discussed how sleep disruption can increase risks for accidents, such as while driving.  Weight loss as a means of improving sleep apnea was also reviewed.  Additional treatment options discussed were CPAP therapy, oral appliance, and surgical intervention.  Assessment/plan:  Snoring with concern for obstructive sleep apnea. - will arrange for home sleep study   Patient Instructions  Will arrange for home sleep study Will call to arrange for follow up after sleep study reviewed     Coralyn HellingVineet Ceara Wrightson, M.D. Pager [phone] 02/10/2016, 9:39 AM

## 2016-02-10 NOTE — Progress Notes (Signed)
   Subjective:    Patient ID: Karl Jacobs, male    DOB: 1965-10-14, 51 y.o.   MRN: [id number]  HPI    Review of Systems  Constitutional: Negative for fever and unexpected weight change.  HENT: Negative for congestion, dental problem, ear pain, nosebleeds, postnasal drip, rhinorrhea, sinus pressure, sneezing, sore throat and trouble swallowing.   Eyes: Negative for redness and itching.  Respiratory: Negative for cough, chest tightness, shortness of breath and wheezing.   Cardiovascular: Negative for palpitations and leg swelling.  Gastrointestinal: Negative for nausea and vomiting.  Genitourinary: Negative for dysuria.  Musculoskeletal: Negative for joint swelling.  Skin: Negative for rash.  Neurological: Negative for headaches.  Hematological: Does not bruise/bleed easily.  Psychiatric/Behavioral: Negative for dysphoric mood. The patient is not nervous/anxious.        Objective:   Physical Exam        Assessment & Plan:

## 2016-02-10 NOTE — Patient Instructions (Signed)
Will arrange for home sleep study Will call to arrange for follow up after sleep study reviewed  

## 2016-02-20 DIAGNOSIS — G4733 Obstructive sleep apnea (adult) (pediatric): Secondary | ICD-10-CM | POA: Diagnosis not present

## 2016-02-23 MED FILL — VIIBRYD 20 MG TABLET: 20 | 30 days supply | Qty: 30 | Fill #4

## 2016-03-02 ENCOUNTER — Other Ambulatory Visit: Payer: Self-pay | Admitting: *Deleted

## 2016-03-02 ENCOUNTER — Telehealth: Payer: Self-pay | Admitting: Pulmonary Disease

## 2016-03-02 DIAGNOSIS — G4733 Obstructive sleep apnea (adult) (pediatric): Secondary | ICD-10-CM | POA: Insufficient documentation

## 2016-03-02 DIAGNOSIS — R0683 Snoring: Secondary | ICD-10-CM

## 2016-03-02 NOTE — Telephone Encounter (Signed)
HST 02/20/16 >> AHI 5.5, SaO2 low 84%   Will have my nurse inform pt that sleep study shows mild sleep apnea.  He needs ROV with me to review tx options.  Okay to double book visit.

## 2016-03-02 NOTE — Telephone Encounter (Signed)
Spoke with patient and informed him of sleep study results. Pt has follow up appointment scheduled for 03/07/2016 at 430. Pt had no additional questions. Nothing further needed.

## 2016-03-06 MED FILL — SUBOXONE 8 MG-2 MG SL FILM: 8-2 | 30 days supply | Qty: 90 | Fill #0

## 2016-03-07 ENCOUNTER — Ambulatory Visit (INDEPENDENT_AMBULATORY_CARE_PROVIDER_SITE_OTHER): Payer: 59 | Admitting: Pulmonary Disease

## 2016-03-07 ENCOUNTER — Encounter: Payer: Self-pay | Admitting: Pulmonary Disease

## 2016-03-07 VITALS — BP 104/70 | HR 91 | Ht 72.0 in | Wt 200.0 lb

## 2016-03-07 DIAGNOSIS — G4733 Obstructive sleep apnea (adult) (pediatric): Secondary | ICD-10-CM

## 2016-03-07 NOTE — Progress Notes (Signed)
Current Outpatient Prescriptions on File Prior to Visit  Medication Sig  . buprenorphine-naloxone (SUBOXONE) 8-2 MG SUBL SL tablet Place 1 tablet under the tongue 3 (three) times daily.  Marland Kitchen. VIAGRA 100 MG tablet Take 100 mg by mouth as needed.  . Vilazodone HCl (VIIBRYD) 20 MG TABS Take 20 mg by mouth daily.   No current facility-administered medications on file prior to visit.      Chief Complaint  Patient presents with  . Follow-up    Review sleep study results.      Sleep tests HST 02/20/16 >> AHI 5.5, SaO2 low 84%  Past medical history Back pain, Depression, HLD, PTSD   Past surgical history, Family history, Social history, Allergies all reviewed.  Vital Signs BP 104/70 (BP Location: Left Arm, Cuff Size: Normal)   Pulse 91   Ht 6' (1.829 m)   Wt 200 lb (90.7 kg)   SpO2 94%   BMI 27.12 kg/m   History of Present Illness Karl HarkJeffrey S Shibuya is a 51 y.o. male with obstructive sleep apnea.  He is here to review his home sleep study.  This showed mild sleep apnea.  He is concerned about his snoring and sleepiness during the day.  Physical Exam  General - No distress ENT - No sinus tenderness, no oral exudate, no LAN, MP 3, enlarged tongue, elongated uvula Cardiac - s1s2 regular, no murmur Chest - No wheeze/rales/dullness Back - No focal tenderness Abd - Soft, non-tender Ext - No edema Neuro - Normal strength Skin - No rashes Psych - normal mood, and behavior   Assessment/Plan  Obstructive sleep apnea. - We discussed how sleep apnea can affect various health problems, including risks for hypertension, cardiovascular disease, and diabetes.  We also discussed how sleep disruption can increase risks for accidents, such as while driving.  Weight loss as a means of improving sleep apnea was also reviewed.  Additional treatment options discussed were CPAP therapy, oral appliance, and surgical intervention. - will arrange for auto CPAP set up   Patient Instructions  Will  arrange for auto CPAP set up  Follow up in 2 months with Dr. Craige CottaSood or Nurse Practitioner    Coralyn HellingVineet Keiran Sias, MD [facility] Pager:  [phone] 03/07/2016, 5:05 PM

## 2016-03-07 NOTE — Patient Instructions (Signed)
Will arrange for auto CPAP set up  Follow up in 2 months with Dr. Ruble Buttler or Nurse Practitioner 

## 2016-03-20 DIAGNOSIS — G4733 Obstructive sleep apnea (adult) (pediatric): Secondary | ICD-10-CM | POA: Diagnosis not present

## 2016-04-04 DIAGNOSIS — F332 Major depressive disorder, recurrent severe without psychotic features: Secondary | ICD-10-CM | POA: Diagnosis not present

## 2016-04-04 DIAGNOSIS — F1121 Opioid dependence, in remission: Secondary | ICD-10-CM | POA: Diagnosis not present

## 2016-04-04 DIAGNOSIS — F4312 Post-traumatic stress disorder, chronic: Secondary | ICD-10-CM | POA: Diagnosis not present

## 2016-04-10 ENCOUNTER — Encounter: Payer: Self-pay | Admitting: Family Medicine

## 2016-04-10 ENCOUNTER — Ambulatory Visit (INDEPENDENT_AMBULATORY_CARE_PROVIDER_SITE_OTHER): Payer: 59 | Admitting: Family Medicine

## 2016-04-10 VITALS — BP 115/68 | HR 73 | Temp 98.6°F | Ht 72.0 in | Wt 227.6 lb

## 2016-04-10 DIAGNOSIS — Z0000 Encounter for general adult medical examination without abnormal findings: Secondary | ICD-10-CM

## 2016-04-10 DIAGNOSIS — E785 Hyperlipidemia, unspecified: Secondary | ICD-10-CM

## 2016-04-10 DIAGNOSIS — G4733 Obstructive sleep apnea (adult) (pediatric): Secondary | ICD-10-CM

## 2016-04-10 DIAGNOSIS — F17200 Nicotine dependence, unspecified, uncomplicated: Secondary | ICD-10-CM | POA: Diagnosis not present

## 2016-04-10 DIAGNOSIS — M542 Cervicalgia: Secondary | ICD-10-CM | POA: Diagnosis not present

## 2016-04-10 DIAGNOSIS — E782 Mixed hyperlipidemia: Secondary | ICD-10-CM

## 2016-04-10 DIAGNOSIS — R739 Hyperglycemia, unspecified: Secondary | ICD-10-CM

## 2016-04-10 DIAGNOSIS — Z125 Encounter for screening for malignant neoplasm of prostate: Secondary | ICD-10-CM

## 2016-04-10 DIAGNOSIS — M545 Low back pain: Secondary | ICD-10-CM

## 2016-04-10 HISTORY — DX: Cervicalgia: M54.2

## 2016-04-10 HISTORY — DX: Nicotine dependence, unspecified, uncomplicated: F17.200

## 2016-04-10 LAB — LIPID PANEL
CHOL/HDL RATIO: 6
Cholesterol: 213 mg/dL — ABNORMAL HIGH (ref 0–200)
HDL: 33.7 mg/dL — AB (ref >39.00)
NONHDL: 179.13
TRIGLYCERIDES: 367 mg/dL — AB (ref 0.0–149.0)
VLDL: 73.4 mg/dL — AB (ref 0.0–40.0)

## 2016-04-10 LAB — COMPREHENSIVE METABOLIC PANEL
ALT: 23 U/L (ref 0–53)
AST: 19 U/L (ref 0–37)
Albumin: 4.2 g/dL (ref 3.5–5.2)
Alkaline Phosphatase: 69 U/L (ref 39–117)
BUN: 16 mg/dL (ref 6–23)
CHLORIDE: 106 meq/L (ref 96–112)
CO2: 27 meq/L (ref 19–32)
Calcium: 9.5 mg/dL (ref 8.4–10.5)
Creatinine, Ser: 0.9 mg/dL (ref 0.40–1.50)
GFR: 94.24 mL/min (ref >60.00)
GLUCOSE: 85 mg/dL (ref 70–99)
POTASSIUM: 4.3 meq/L (ref 3.5–5.1)
Sodium: 139 mEq/L (ref 135–145)
Total Bilirubin: 0.4 mg/dL (ref 0.2–1.2)
Total Protein: 6.4 g/dL (ref 6.0–8.3)

## 2016-04-10 LAB — LDL CHOLESTEROL, DIRECT: Direct LDL: 112 mg/dL

## 2016-04-10 LAB — PSA: PSA: 1.09 ng/mL (ref 0.10–4.00)

## 2016-04-10 LAB — HEMOGLOBIN A1C: Hgb A1c MFr Bld: 6.1 % (ref 4.6–6.5)

## 2016-04-10 IMAGING — DX DG LUMBAR SPINE COMPLETE 4+V
5 series · 5 of 5 positions shown · non-contrast
Comparison: 08/27/2012

CLINICAL DATA: Low back pain

EXAM:
LUMBAR SPINE - COMPLETE 4+ VIEW

[l-spine ap]
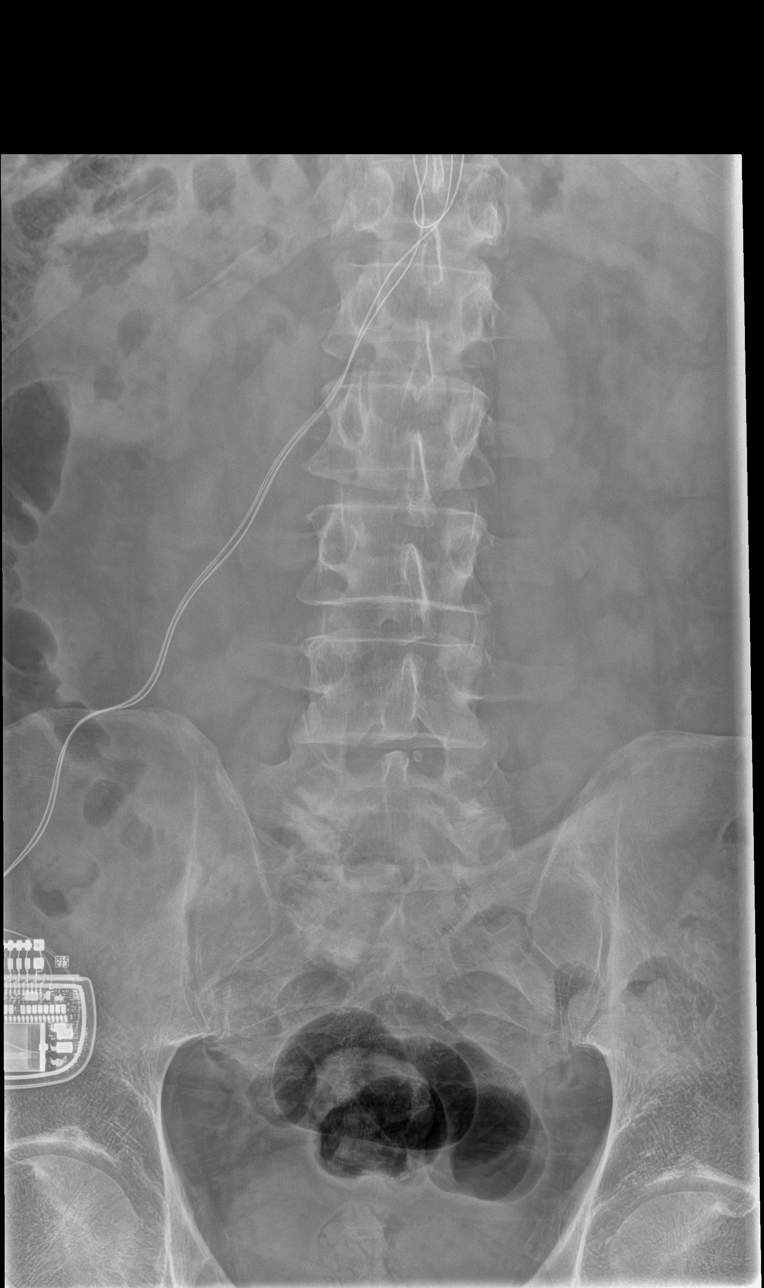

[l-spine obl (1 of 2)]
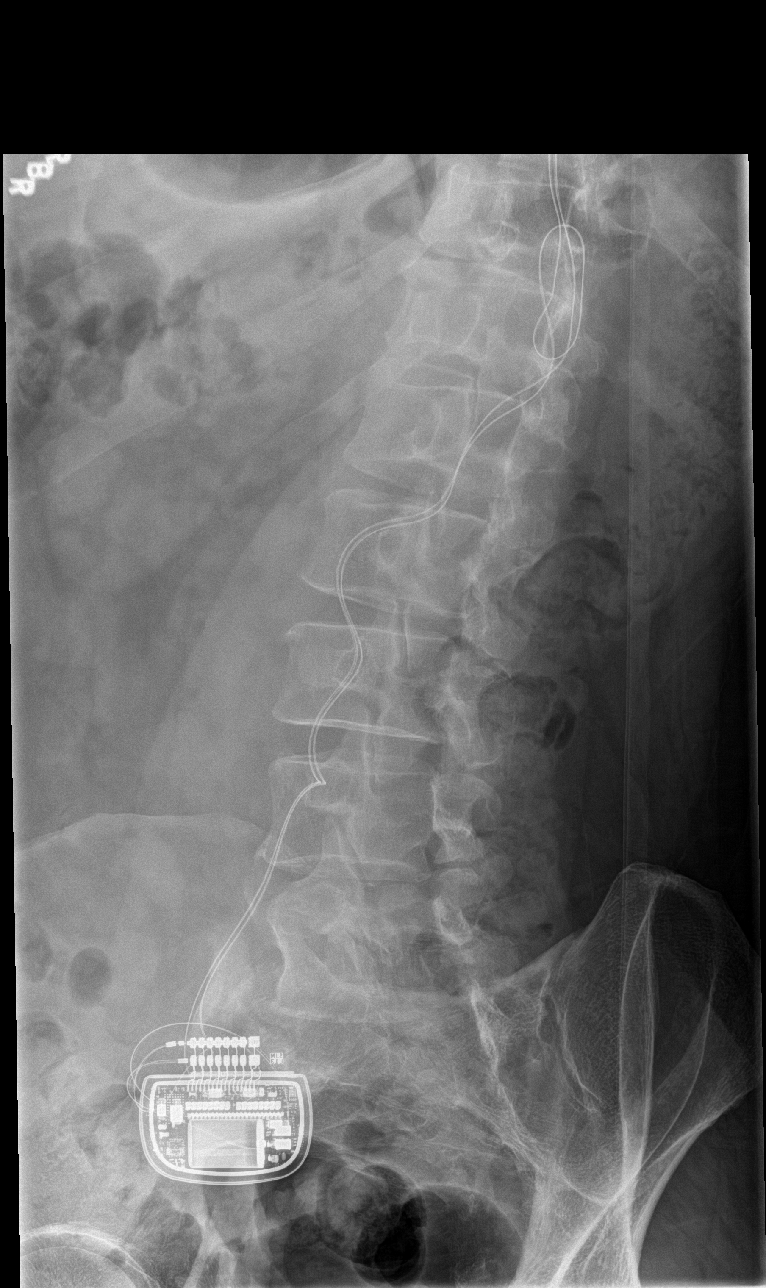

[l-spine obl (2 of 2)]
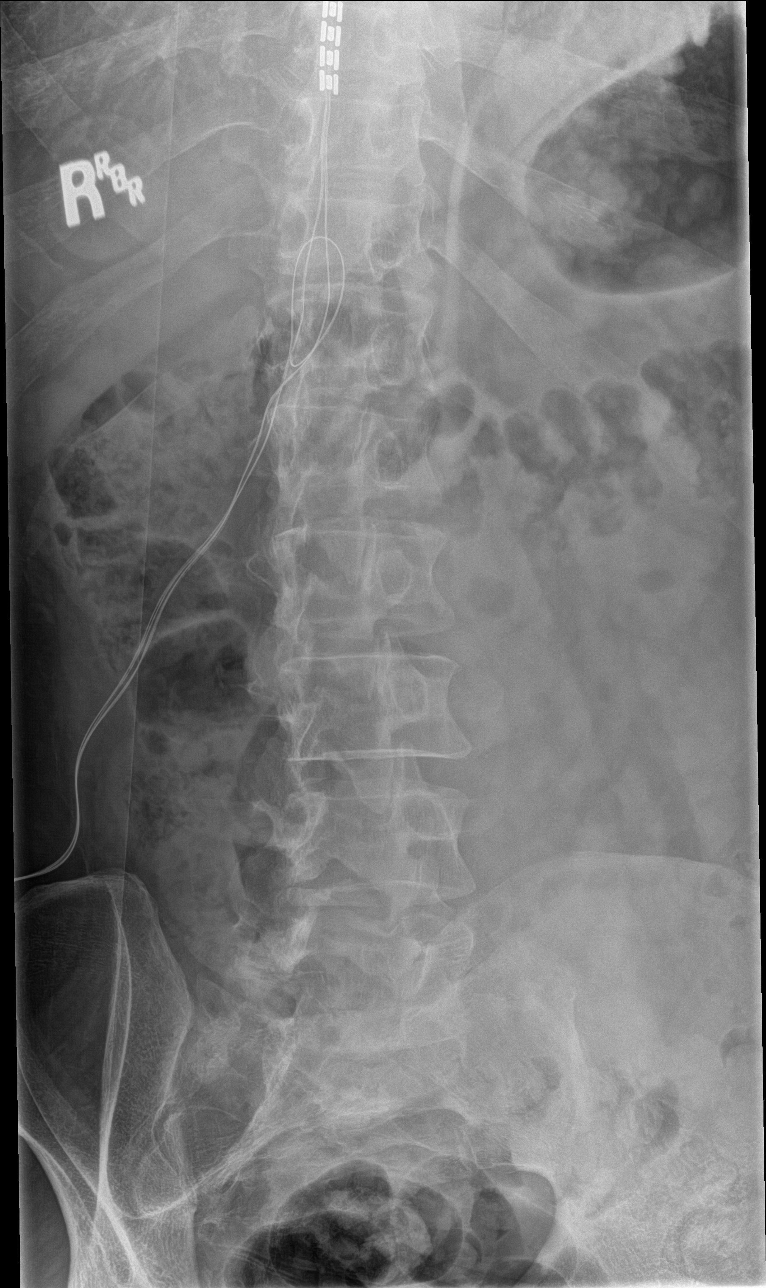

[l-spine lat]
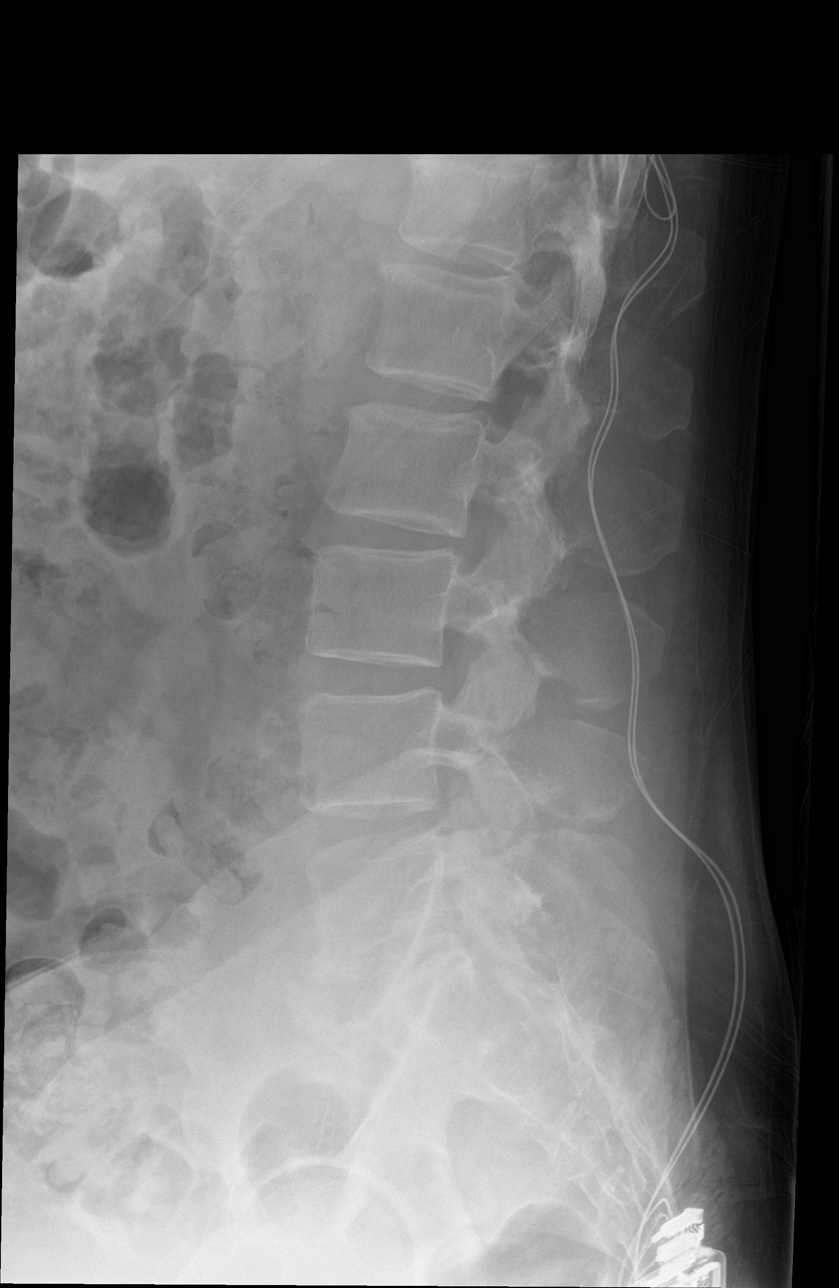

[l-spine spot]
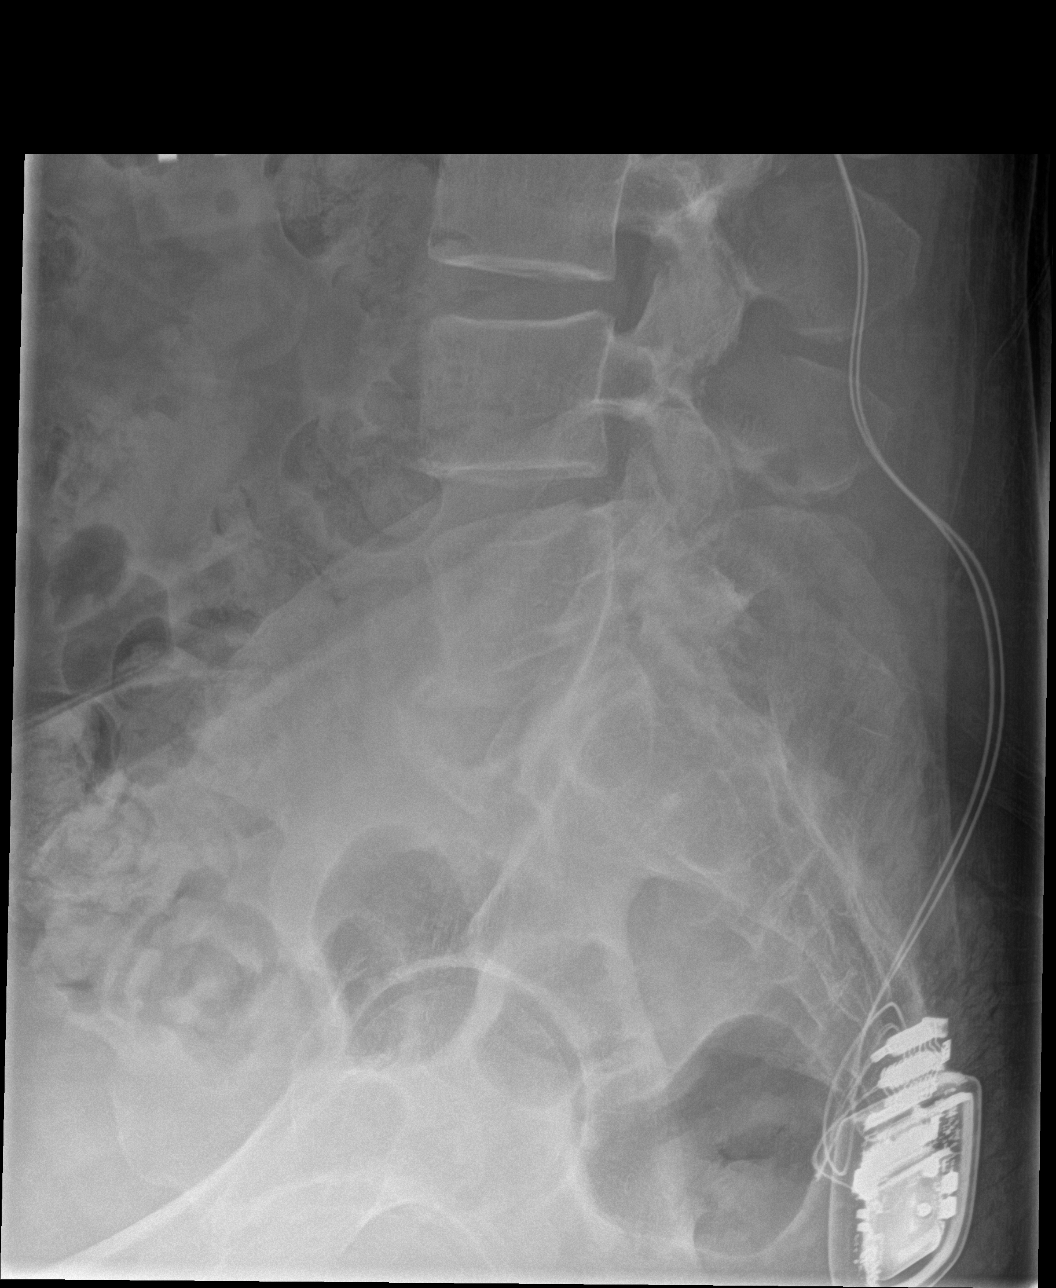

[5 of 5 positions shown; findings below may reference images not displayed]

FINDINGS: Spinal cord stimulator traversing into the central canal at T10-T11
is stable. Anatomic alignment. Mild narrowing of the L4-5 and L5-S1
discs. No vertebral compression deformity. No definite pars defect.
IMPRESSION: No acute bony pathology.  Chronic change.

## 2016-04-10 MED ORDER — NICOTINE 14 MG/24HR TD PT24: 14.0000 mg | MEDICATED_PATCH | Freq: Every day | TRANSDERMAL | 1 refills | Status: DC

## 2016-04-10 MED ORDER — NICOTINE 7 MG/24HR TD PT24: 7.0000 mg | MEDICATED_PATCH | Freq: Every day | TRANSDERMAL | 1 refills | Status: DC

## 2016-04-10 MED ORDER — NICOTINE 21 MG/24HR TD PT24: 21.0000 mg | MEDICATED_PATCH | Freq: Every day | TRANSDERMAL | 1 refills | Status: DC

## 2016-04-10 MED FILL — NICOTINE 21 MG/24HR PATCH: 21 | 28 days supply | Qty: 28 | Fill #0

## 2016-04-10 NOTE — Progress Notes (Signed)
Pre visit review using our clinic review tool, if applicable. No additional management support is needed unless otherwise documented below in the visit note. 

## 2016-04-10 NOTE — Patient Instructions (Signed)
Preventive Care 40-64 Years, Male Preventive care refers to lifestyle choices and visits with your health care provider that can promote health and wellness. What does preventive care include?  A yearly physical exam. This is also called an annual well check.  Dental exams once or twice a year.  Routine eye exams. Ask your health care provider how often you should have your eyes checked.  Personal lifestyle choices, including:  Daily care of your teeth and gums.  Regular physical activity.  Eating a healthy diet.  Avoiding tobacco and drug use.  Limiting alcohol use.  Practicing safe sex.  Taking low-dose aspirin every day starting at age 50. What happens during an annual well check? The services and screenings done by your health care provider during your annual well check will depend on your age, overall health, lifestyle risk factors, and family history of disease. Counseling  Your health care provider may ask you questions about your:  Alcohol use.  Tobacco use.  Drug use.  Emotional well-being.  Home and relationship well-being.  Sexual activity.  Eating habits.  Work and work environment. Screening  You may have the following tests or measurements:  Height, weight, and BMI.  Blood pressure.  Lipid and cholesterol levels. These may be checked every 5 years, or more frequently if you are over 50 years old.  Skin check.  Lung cancer screening. You may have this screening every year starting at age 55 if you have a 30-pack-year history of smoking and currently smoke or have quit within the past 15 years.  Fecal occult blood test (FOBT) of the stool. You may have this test every year starting at age 50.  Flexible sigmoidoscopy or colonoscopy. You may have a sigmoidoscopy every 5 years or a colonoscopy every 10 years starting at age 50.  Prostate cancer screening. Recommendations will vary depending on your family history and other risks.  Hepatitis C  blood test.  Hepatitis B blood test.  Sexually transmitted disease (STD) testing.  Diabetes screening. This is done by checking your blood sugar (glucose) after you have not eaten for a while (fasting). You may have this done every 1-3 years. Discuss your test results, treatment options, and if necessary, the need for more tests with your health care provider. Vaccines  Your health care provider may recommend certain vaccines, such as:  Influenza vaccine. This is recommended every year.  Tetanus, diphtheria, and acellular pertussis (Tdap, Td) vaccine. You may need a Td booster every 10 years.  Varicella vaccine. You may need this if you have not been vaccinated.  Zoster vaccine. You may need this after age 60.  Measles, mumps, and rubella (MMR) vaccine. You may need at least one dose of MMR if you were born in 1957 or later. You may also need a second dose.  Pneumococcal 13-valent conjugate (PCV13) vaccine. You may need this if you have certain conditions and have not been vaccinated.  Pneumococcal polysaccharide (PPSV23) vaccine. You may need one or two doses if you smoke cigarettes or if you have certain conditions.  Meningococcal vaccine. You may need this if you have certain conditions.  Hepatitis A vaccine. You may need this if you have certain conditions or if you travel or work in places where you may be exposed to hepatitis A.  Hepatitis B vaccine. You may need this if you have certain conditions or if you travel or work in places where you may be exposed to hepatitis B.  Haemophilus influenzae type b (Hib)   vaccine. You may need this if you have certain risk factors. Talk to your health care provider about which screenings and vaccines you need and how often you need them. This information is not intended to replace advice given to you by your health care provider. Make sure you discuss any questions you have with your health care provider. Document Released: 02/19/2015  Document Revised: 10/13/2015 Document Reviewed: 11/24/2014 Elsevier Interactive Patient Education  2017 [employer].

## 2016-04-10 NOTE — Progress Notes (Signed)
Patient ID: Karl Jacobs, male   DOB: October 25, 1965, 51 y.o.   MRN: [id number]   Subjective:  I acted as a Neurosurgeon for Karl Edge, MD. [place], [place]   Patient ID: Karl Jacobs, male    DOB: 11/07/1965, 51 y.o.   MRN: [id number]  Chief Complaint  Patient presents with  . Annual Exam  . Hyperlipidemia  . Hyperglycemia    Hyperlipidemia  This is a chronic problem. The problem is controlled. Pertinent negatives include no chest pain or shortness of breath.  Hyperglycemia  This is a chronic problem. Associated symptoms include neck pain. Pertinent negatives include no chest pain, congestion, coughing, fever, headaches, rash or vomiting.  Neck Pain   This is a new problem. The current episode started more than 1 month ago. The problem occurs constantly. The problem has been waxing and waning. The quality of the pain is described as stabbing. Pertinent negatives include no chest pain, fever or headaches. The treatment provided no relief.    Patient is in today for an annual examination. Patient has a Hx of hyperglycemia and hypertension. Patient also has a complaint of neck pain for the past two months. He notes the neck pain has been present to some extent every day for past couple of months. It will happen most with flexion last for minutes and then resolves. he is ready to get it worked up. Does not keep him up at night. No radicular symptoms down arms. No headaches or trauma. Denies CP/palp/SOB/HA/congestion/fevers/GI or GU c/o. Taking meds as prescribed   Patient Care Team: Bradd Canary, MD as PCP - General (Family Medicine) Leanord Hawking, MD as Consulting Physician (Neurology)   Past Medical History:  Diagnosis Date  . Chronic back pain   . Depression   . Hyperlipidemia, mixed 04/18/2015  . Neck pain 04/10/2016  . Post traumatic stress disorder (PTSD)    H/O .Marland Kitchen...  "FELT BETTER IN 2006"  . Tobacco smoker within last 12 months 04/10/2016    Past Surgical History:  Procedure  Laterality Date  . APPENDECTOMY    . BACK SURGERY     X 3  . SPINAL CORD STIMULATOR INSERTION  10/19/2011   Procedure: LUMBAR SPINAL CORD STIMULATOR INSERTION;  Surgeon: Venita Lick, MD;  Location: [facility] OR;  Service: Orthopedics;  Laterality: N/A;  SPINAL CORD STIMULATOR PLACEMENT  . TONSILLECTOMY    . TONSILLECTOMY    . VASECTOMY     & REVERSAL IN 2008  . WISDOM TOOTH EXTRACTION      Family History  Problem Relation Age of Onset  . Other Mother     Paget's Disease   . Hypertension Father   . Diabetes Father     type 2  . Cancer Father     hodgkins lymphoma  . Hyperlipidemia Father   . Arthritis Father   . Other Paternal Grandfather     carbon monoxide  . Stroke Paternal Grandfather   . Alcohol abuse Brother   . Cancer Brother 48    colon ca, polyp  . Stroke Maternal Grandfather     ?  Marland Kitchen Pneumonia Paternal Grandmother   . Healthy Son 66  . Healthy Daughter 25  . Healthy Daughter 3    Social History   Social History  . Marital status: Married    Spouse name: N/A  . Number of children: N/A  . Years of education: N/A   Occupational History  . Research officer, trade [employer]    Social History Main Topics  .  Smoking status: Current Every Day Smoker    Packs/day: 1.00    Years: 33.00    Types: Cigarettes    Last attempt to quit: 11/06/2012  . Smokeless tobacco: Never Used     Comment: quit back in 2014 and started back 2016  . Alcohol use No  . Drug use: No  . Sexual activity: Yes    Birth control/ protection: None     Comment: lives with wife, daughers, son  (when from college), no dietary restrictions   Other Topics Concern  . Not on file   Social History Narrative  . No narrative on file    Outpatient Medications Prior to Visit  Medication Sig Dispense Refill  . buprenorphine-naloxone (SUBOXONE) 8-2 MG SUBL SL tablet Place 1 tablet under the tongue 3 (three) times daily.    Marland Kitchen VIAGRA 100 MG tablet Take 100 mg by mouth as needed.  11  . Vilazodone HCl (VIIBRYD)  20 MG TABS Take 20 mg by mouth daily.     No facility-administered medications prior to visit.     No Known Allergies  Review of Systems  Constitutional: Negative for fever and malaise/fatigue.  HENT: Negative for congestion.   Eyes: Negative for blurred vision.  Respiratory: Negative for cough and shortness of breath.   Cardiovascular: Negative for chest pain, palpitations and leg swelling.  Gastrointestinal: Negative for vomiting.  Musculoskeletal: Positive for back pain and neck pain.  Skin: Negative for rash.  Neurological: Negative for loss of consciousness and headaches.       Objective:    Physical Exam  Constitutional: He is oriented to person, place, and time. He appears well-developed and well-nourished. No distress.  HENT:  Head: Normocephalic and atraumatic.  Eyes: Conjunctivae are normal.  Neck: Normal range of motion. No thyromegaly present.  Cardiovascular: Normal rate and regular rhythm.   Pulmonary/Chest: Effort normal and breath sounds normal. He has no wheezes.  Abdominal: Soft. Bowel sounds are normal. There is no tenderness.  Musculoskeletal: He exhibits no edema or deformity.  Neurological: He is alert and oriented to person, place, and time.  Skin: Skin is warm and dry. He is not diaphoretic.  Psychiatric: He has a normal mood and affect.    BP 115/68 (BP Location: Left Arm, Patient Position: Sitting, Cuff Size: Large)   Pulse 73   Temp 98.6 F (37 C) (Oral)   Ht 6' (1.829 m)   Wt 227 lb 9.6 oz (103.2 kg)   SpO2 100% Comment: RA  BMI 30.87 kg/m  Wt Readings from Last 3 Encounters:  04/10/16 227 lb 9.6 oz (103.2 kg)  03/07/16 200 lb (90.7 kg)  02/10/16 222 lb 9.6 oz (101 kg)     Lab Results  Component Value Date   WBC 9.1 04/09/2015   HGB 16.0 04/09/2015   HCT 48.0 04/09/2015   PLT 173 04/09/2015   GLUCOSE 97 04/09/2015   CHOL 230 (H) 04/09/2015   TRIG 416 (H) 04/09/2015   HDL 33 (L) 04/09/2015   LDLCALC NOT CALC 04/09/2015   ALT  21 04/09/2015   AST 21 04/09/2015   NA 138 04/09/2015   K 4.4 04/09/2015   CL 104 04/09/2015   CREATININE 0.94 04/09/2015   BUN 20 04/09/2015   CO2 24 04/09/2015   TSH 0.99 04/09/2015   HGBA1C 5.8 (H) 04/09/2015    Lab Results  Component Value Date   TSH 0.99 04/09/2015   Lab Results  Component Value Date   WBC 9.1 04/09/2015  HGB 16.0 04/09/2015   HCT 48.0 04/09/2015   MCV 82.9 04/09/2015   PLT 173 04/09/2015   Lab Results  Component Value Date   NA 138 04/09/2015   K 4.4 04/09/2015   CO2 24 04/09/2015   GLUCOSE 97 04/09/2015   BUN 20 04/09/2015   CREATININE 0.94 04/09/2015   BILITOT 0.3 04/09/2015   ALKPHOS 65 04/09/2015   AST 21 04/09/2015   ALT 21 04/09/2015   PROT 6.5 04/09/2015   ALBUMIN 4.2 04/09/2015   CALCIUM 9.6 04/09/2015   Lab Results  Component Value Date   CHOL 230 (H) 04/09/2015   Lab Results  Component Value Date   HDL 33 (L) 04/09/2015   Lab Results  Component Value Date   LDLCALC NOT CALC 04/09/2015   Lab Results  Component Value Date   TRIG 416 (H) 04/09/2015   Lab Results  Component Value Date   CHOLHDL 7.0 (H) 04/09/2015   Lab Results  Component Value Date   HGBA1C 5.8 (H) 04/09/2015       Assessment & Plan:   Problem List Items Addressed This Visit    Hyperglycemia - Primary    hgba1c acceptable, minimize simple carbs. Increase exercise as tolerated.       Relevant Orders   Hemoglobin A1c   Low back pain    Encouraged moist heat and gentle stretching as tolerated. May try NSAIDs and prescription meds as directed and report if symptoms worsen or seek immediate care      Preventative health care   Relevant Orders   DG Neck Soft Tissue   Ambulatory referral to Sports Medicine   Hemoglobin A1c   Lipid panel   Comprehensive metabolic panel   PSA   Hyperlipidemia, mixed    Encouraged heart healthy diet, increase exercise, avoid trans fats, consider a krill oil cap daily      OSA (obstructive sleep apnea)     Uses CPAP nightly for 6-7 hours. Follows with Dr Craige Cotta      Neck pain    Worsening, describes sharp pain last minutes at some point each day especially with flexion, will proceed with xray and refer to sports med for further management      Relevant Orders   DG Neck Soft Tissue   Ambulatory referral to Sports Medicine   Tobacco smoker within last 12 months    Encouraged complete cessation. Discussed need to quit as relates to risk of numerous cancers, cardiac and pulmonary disease as well as neurologic complications. Counseled for greater than 3 minutes. Given patches to taper with       Other Visit Diagnoses    Hyperlipidemia, unspecified hyperlipidemia type       Relevant Orders   Lipid panel   Comprehensive metabolic panel   Prostate cancer screening       Relevant Orders   PSA      I am having Mr. Scalera start on nicotine, nicotine, and nicotine. I am also having him maintain his buprenorphine-naloxone, Vilazodone HCl, and VIAGRA.  Meds ordered this encounter  Medications  . nicotine (NICODERM CQ - DOSED IN MG/24 HR) 7 mg/24hr patch    Sig: Place 1 patch (7 mg total) onto the skin daily.    Dispense:  30 patch    Refill:  1    ADMINISTER ONCE THE NICOTINE 14 HAS BEEN COMPLETED.  Marland Kitchen nicotine (NICODERM CQ - DOSED IN MG/24 HOURS) 14 mg/24hr patch    Sig: Place 1 patch (14 mg total)  onto the skin daily.    Dispense:  30 patch    Refill:  1    ADMINISTER ONCE THE NICOTINE 21 HAS BEEN COMPLETED.  Marland Kitchen. nicotine (NICODERM CQ - DOSED IN MG/24 HOURS) 21 mg/24hr patch    Sig: Place 1 patch (21 mg total) onto the skin daily.    Dispense:  30 patch    Refill:  1    CMA served as Neurosurgeonscribe during this visit. History, Physical and Plan performed by medical provider. Documentation and orders reviewed and attested to.   Karl EdgeStacey Blyth, MD

## 2016-04-10 NOTE — Assessment & Plan Note (Signed)
Encouraged heart healthy diet, increase exercise, avoid trans fats, consider a krill oil cap daily 

## 2016-04-10 NOTE — Assessment & Plan Note (Signed)
Encouraged complete cessation. Discussed need to quit as relates to risk of numerous cancers, cardiac and pulmonary disease as well as neurologic complications. Counseled for greater than 3 minutes. Given patches to taper with

## 2016-04-10 NOTE — Assessment & Plan Note (Signed)
hgba1c acceptable, minimize simple carbs. Increase exercise as tolerated.  

## 2016-04-10 NOTE — Assessment & Plan Note (Signed)
Worsening, describes sharp pain last minutes at some point each day especially with flexion, will proceed with xray and refer to sports med for further management

## 2016-04-10 NOTE — Assessment & Plan Note (Signed)
Encouraged moist heat and gentle stretching as tolerated. May try NSAIDs and prescription meds as directed and report if symptoms worsen or seek immediate care 

## 2016-04-10 NOTE — Assessment & Plan Note (Signed)
Uses CPAP nightly for 6-7 hours. Follows with Dr Craige CottaSood

## 2016-04-11 ENCOUNTER — Ambulatory Visit (HOSPITAL_BASED_OUTPATIENT_CLINIC_OR_DEPARTMENT_OTHER)
Admission: RE | Admit: 2016-04-11 | Discharge: 2016-04-11 | Disposition: A | Discharge status: Home / Self Care | Payer: 59 | Source: Ambulatory Visit | Attending: Family Medicine | Admitting: Family Medicine

## 2016-04-11 DIAGNOSIS — M542 Cervicalgia: Secondary | ICD-10-CM | POA: Diagnosis not present

## 2016-04-11 DIAGNOSIS — Z0000 Encounter for general adult medical examination without abnormal findings: Secondary | ICD-10-CM

## 2016-04-11 DIAGNOSIS — Q7649 Other congenital malformations of spine, not associated with scoliosis: Secondary | ICD-10-CM | POA: Insufficient documentation

## 2016-04-13 MED FILL — VIIBRYD 20 MG TABLET: 20 | 30 days supply | Qty: 30 | Fill #5

## 2016-04-14 MED FILL — SUBOXONE 8 MG-2 MG SL FILM: 8-2 | 30 days supply | Qty: 90 | Fill #0

## 2016-04-17 DIAGNOSIS — G4733 Obstructive sleep apnea (adult) (pediatric): Secondary | ICD-10-CM | POA: Diagnosis not present

## 2016-04-19 ENCOUNTER — Ambulatory Visit: Payer: 59 | Admitting: Family Medicine

## 2016-05-03 ENCOUNTER — Ambulatory Visit: Payer: 59 | Admitting: Pulmonary Disease

## 2016-05-04 DIAGNOSIS — M542 Cervicalgia: Secondary | ICD-10-CM | POA: Diagnosis not present

## 2016-05-10 MED FILL — VIIBRYD 20 MG TABLET: 20 | 30 days supply | Qty: 30 | Fill #0

## 2016-05-10 MED FILL — SUBOXONE 8 MG-2 MG SL FILM: 8-2 | 30 days supply | Qty: 90 | Fill #0

## 2016-05-16 ENCOUNTER — Encounter: Payer: Self-pay | Admitting: Adult Health

## 2016-05-16 ENCOUNTER — Ambulatory Visit (INDEPENDENT_AMBULATORY_CARE_PROVIDER_SITE_OTHER): Payer: 59 | Admitting: Adult Health

## 2016-05-16 DIAGNOSIS — G4733 Obstructive sleep apnea (adult) (pediatric): Secondary | ICD-10-CM

## 2016-05-16 NOTE — Assessment & Plan Note (Signed)
Mild OSA w/ good control on CPAP   Plan  Patient Instructions  Continue on CPAP At bedtime.  Work on weight loss  Do not drive if sleepy  Follow up Dr. Craige Cotta  4-6 months and As needed
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
## 2016-05-16 NOTE — Patient Instructions (Addendum)
Continue on CPAP At bedtime.  Work on weight loss  Do not drive if sleepy  Follow up Dr. Craige Cotta  4-6 months and As needed

## 2016-05-17 ENCOUNTER — Ambulatory Visit: Payer: 59 | Admitting: Physical Therapy

## 2016-05-17 NOTE — Addendum Note (Signed)
Addended by: Boone Master E on: 05/17/2016 01:15 PM   Modules accepted: Orders

## 2016-05-17 NOTE — Progress Notes (Signed)
I have reviewed and agree with assessment/plan.  Shirl Weir, MD [facility] 05/17/2016, 9:05 AM Pager:  [phone]  

## 2016-05-18 DIAGNOSIS — G4733 Obstructive sleep apnea (adult) (pediatric): Secondary | ICD-10-CM | POA: Diagnosis not present

## 2016-05-25 DIAGNOSIS — G4733 Obstructive sleep apnea (adult) (pediatric): Secondary | ICD-10-CM | POA: Diagnosis not present

## 2016-05-31 DIAGNOSIS — F1121 Opioid dependence, in remission: Secondary | ICD-10-CM | POA: Diagnosis not present

## 2016-05-31 DIAGNOSIS — F4312 Post-traumatic stress disorder, chronic: Secondary | ICD-10-CM | POA: Diagnosis not present

## 2016-05-31 DIAGNOSIS — F332 Major depressive disorder, recurrent severe without psychotic features: Secondary | ICD-10-CM | POA: Diagnosis not present

## 2016-06-12 MED FILL — SUBOXONE 8 MG-2 MG SL FILM: 8-2 | 30 days supply | Qty: 90 | Fill #0

## 2016-06-13 MED FILL — VIIBRYD 20 MG TABLET: 20 | 30 days supply | Qty: 30 | Fill #0

## 2016-06-17 DIAGNOSIS — G4733 Obstructive sleep apnea (adult) (pediatric): Secondary | ICD-10-CM | POA: Diagnosis not present

## 2016-07-10 MED FILL — SUBOXONE 8 MG-2 MG SL FILM: 8-2 | 30 days supply | Qty: 90 | Fill #0

## 2016-07-11 MED FILL — VIIBRYD 20 MG TABLET: 20 | 30 days supply | Qty: 30 | Fill #1

## 2016-07-18 DIAGNOSIS — G4733 Obstructive sleep apnea (adult) (pediatric): Secondary | ICD-10-CM | POA: Diagnosis not present

## 2016-08-11 MED FILL — SUBOXONE 8 MG-2 MG SL FILM: 8-2 | 30 days supply | Qty: 90 | Fill #0

## 2016-08-16 MED FILL — VIIBRYD 20 MG TABLET: 20 | 30 days supply | Qty: 30 | Fill #2

## 2016-09-11 MED FILL — VIIBRYD 20 MG TABLET: 20 | 30 days supply | Qty: 30 | Fill #3

## 2016-09-12 MED FILL — SUBOXONE 8 MG-2 MG SL FILM: 8-2 | 30 days supply | Qty: 90 | Fill #0

## 2016-09-18 DIAGNOSIS — F4323 Adjustment disorder with mixed anxiety and depressed mood: Secondary | ICD-10-CM | POA: Diagnosis not present

## 2016-09-26 DIAGNOSIS — F4323 Adjustment disorder with mixed anxiety and depressed mood: Secondary | ICD-10-CM | POA: Diagnosis not present

## 2016-10-12 ENCOUNTER — Ambulatory Visit: Payer: 59 | Admitting: Adult Health

## 2016-10-17 DIAGNOSIS — F1121 Opioid dependence, in remission: Secondary | ICD-10-CM | POA: Diagnosis not present

## 2016-10-17 DIAGNOSIS — F4310 Post-traumatic stress disorder, unspecified: Secondary | ICD-10-CM | POA: Diagnosis not present

## 2016-10-17 DIAGNOSIS — F1021 Alcohol dependence, in remission: Secondary | ICD-10-CM | POA: Diagnosis not present

## 2016-10-17 DIAGNOSIS — G894 Chronic pain syndrome: Secondary | ICD-10-CM | POA: Diagnosis not present

## 2016-10-18 DIAGNOSIS — Z23 Encounter for immunization: Secondary | ICD-10-CM | POA: Diagnosis not present

## 2016-10-18 DIAGNOSIS — A630 Anogenital (venereal) warts: Secondary | ICD-10-CM | POA: Diagnosis not present

## 2016-10-19 MED FILL — SUBOXONE 8 MG-2 MG SL FILM: 8-2 | 30 days supply | Qty: 90 | Fill #0

## 2016-10-19 MED FILL — ARIPiprazole 20 MG TABS: 20 | 30 days supply | Qty: 30 | Fill #0

## 2016-10-19 MED FILL — VIIBRYD 40 MG TABLET: 40 | 30 days supply | Qty: 30 | Fill #0

## 2016-11-07 DIAGNOSIS — F1121 Opioid dependence, in remission: Secondary | ICD-10-CM | POA: Diagnosis not present

## 2016-11-07 DIAGNOSIS — F1021 Alcohol dependence, in remission: Secondary | ICD-10-CM | POA: Diagnosis not present

## 2016-11-07 DIAGNOSIS — F4310 Post-traumatic stress disorder, unspecified: Secondary | ICD-10-CM | POA: Diagnosis not present

## 2016-11-07 DIAGNOSIS — G894 Chronic pain syndrome: Secondary | ICD-10-CM | POA: Diagnosis not present

## 2016-11-10 ENCOUNTER — Ambulatory Visit: Payer: 59 | Admitting: Family Medicine

## 2016-11-12 ENCOUNTER — Encounter (HOSPITAL_BASED_OUTPATIENT_CLINIC_OR_DEPARTMENT_OTHER): Payer: Self-pay | Admitting: Emergency Medicine

## 2016-11-12 ENCOUNTER — Emergency Department (HOSPITAL_BASED_OUTPATIENT_CLINIC_OR_DEPARTMENT_OTHER): Payer: 59

## 2016-11-12 ENCOUNTER — Emergency Department (HOSPITAL_BASED_OUTPATIENT_CLINIC_OR_DEPARTMENT_OTHER)
Admission: EM | Admit: 2016-11-12 | Discharge: 2016-11-12 | Disposition: A | Discharge status: Home / Self Care | Payer: 59 | Attending: Emergency Medicine | Admitting: Emergency Medicine

## 2016-11-12 DIAGNOSIS — Y939 Activity, unspecified: Secondary | ICD-10-CM | POA: Diagnosis not present

## 2016-11-12 DIAGNOSIS — R0789 Other chest pain: Secondary | ICD-10-CM | POA: Diagnosis not present

## 2016-11-12 DIAGNOSIS — Y999 Unspecified external cause status: Secondary | ICD-10-CM | POA: Diagnosis not present

## 2016-11-12 DIAGNOSIS — Y929 Unspecified place or not applicable: Secondary | ICD-10-CM | POA: Insufficient documentation

## 2016-11-12 DIAGNOSIS — R0602 Shortness of breath: Secondary | ICD-10-CM | POA: Diagnosis not present
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
## 2016-11-13 ENCOUNTER — Encounter: Payer: Self-pay | Admitting: Medical

## 2016-11-13 ENCOUNTER — Ambulatory Visit (HOSPITAL_BASED_OUTPATIENT_CLINIC_OR_DEPARTMENT_OTHER)
Admission: RE | Admit: 2016-11-13 | Discharge: 2016-11-13 | Disposition: A | Discharge status: Home / Self Care | Payer: 59 | Source: Ambulatory Visit | Attending: Medical | Admitting: Medical

## 2016-11-13 ENCOUNTER — Ambulatory Visit (INDEPENDENT_AMBULATORY_CARE_PROVIDER_SITE_OTHER): Payer: 59 | Admitting: Medical

## 2016-11-13 VITALS — BP 118/77 | HR 80 | Temp 98.3°F | Resp 16 | Ht 72.0 in | Wt 209.4 lb

## 2016-11-13 DIAGNOSIS — R0781 Pleurodynia: Secondary | ICD-10-CM | POA: Insufficient documentation

## 2016-11-13 DIAGNOSIS — S2232XA Fracture of one rib, left side, initial encounter for closed fracture: Secondary | ICD-10-CM | POA: Insufficient documentation

## 2016-11-13 DIAGNOSIS — X58XXXA Exposure to other specified factors, initial encounter: Secondary | ICD-10-CM | POA: Insufficient documentation

## 2016-11-13 DIAGNOSIS — R0782 Intercostal pain: Secondary | ICD-10-CM

## 2016-11-13 DIAGNOSIS — J9811 Atelectasis: Secondary | ICD-10-CM | POA: Insufficient documentation

## 2016-11-13 DIAGNOSIS — R918 Other nonspecific abnormal finding of lung field: Secondary | ICD-10-CM | POA: Diagnosis not present

## 2016-11-13 DIAGNOSIS — R0981 Nasal congestion: Secondary | ICD-10-CM | POA: Diagnosis not present

## 2016-11-13 DIAGNOSIS — S2242XA Multiple fractures of ribs, left side, initial encounter for closed fracture: Secondary | ICD-10-CM | POA: Diagnosis not present

## 2016-11-13 MED ORDER — KETOROLAC TROMETHAMINE 60 MG/2ML IM SOLN
60.0000 mg | Freq: Once | INTRAMUSCULAR | Status: AC
  Administered 2016-11-13: 60 mg via INTRAMUSCULAR

## 2016-11-13 MED ORDER — FLUTICASONE PROPIONATE 50 MCG/ACT NA SUSP: 2.0000 | Freq: Every day | NASAL | 1 refills | Status: DC

## 2016-11-13 MED ORDER — AZITHROMYCIN 250 MG PO TABS: ORAL_TABLET | ORAL | 0 refills | Status: DC

## 2016-11-13 MED ORDER — CYCLOBENZAPRINE HCL 10 MG PO TABS: 10.0000 mg | ORAL_TABLET | Freq: Every day | ORAL | 0 refills | Status: DC

## 2016-11-13 MED ORDER — CELECOXIB 200 MG PO CAPS: 200.0000 mg | ORAL_CAPSULE | Freq: Two times a day (BID) | ORAL | 1 refills | Status: DC

## 2016-11-13 MED FILL — CYCLOBENZAPRINE 10 MG TAB: 10 | 10 days supply | Qty: 10 | Fill #0

## 2016-11-13 MED FILL — CELECOXIB 200 MG CAPSULE: 200 | 15 days supply | Qty: 30 | Fill #0

## 2016-11-13 MED FILL — FLUTICASONE PROP 50 MCG SPR: 50 | 30 days supply | Qty: 16 | Fill #0
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
## 2016-11-13 NOTE — Addendum Note (Signed)
Addended by: Orlene Och on: 11/13/2016 02:03 PM   Modules accepted: Orders
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
## 2016-11-14 DIAGNOSIS — F4310 Post-traumatic stress disorder, unspecified: Secondary | ICD-10-CM | POA: Diagnosis not present

## 2016-11-16 MED FILL — SUBOXONE 8 MG-2 MG SL FILM: 8-2 | 30 days supply | Qty: 90 | Fill #0

## 2016-11-20 ENCOUNTER — Other Ambulatory Visit: Payer: Self-pay | Admitting: Medical

## 2016-11-21 DIAGNOSIS — F4310 Post-traumatic stress disorder, unspecified: Secondary | ICD-10-CM | POA: Diagnosis not present

## 2016-12-05 DIAGNOSIS — F4312 Post-traumatic stress disorder, chronic: Secondary | ICD-10-CM | POA: Diagnosis not present

## 2016-12-05 DIAGNOSIS — F332 Major depressive disorder, recurrent severe without psychotic features: Secondary | ICD-10-CM | POA: Diagnosis not present

## 2016-12-05 DIAGNOSIS — F1121 Opioid dependence, in remission: Secondary | ICD-10-CM | POA: Diagnosis not present

## 2016-12-06 DIAGNOSIS — F4310 Post-traumatic stress disorder, unspecified: Secondary | ICD-10-CM | POA: Diagnosis not present

## 2016-12-06 MED FILL — traZODone HCL 50 MG TABS: 50 | 30 days supply | Qty: 30 | Fill #0

## 2016-12-06 MED FILL — VIIBRYD 40 MG TABLET: 40 | 30 days supply | Qty: 30 | Fill #1

## 2016-12-12 DIAGNOSIS — F4310 Post-traumatic stress disorder, unspecified: Secondary | ICD-10-CM | POA: Diagnosis not present

## 2016-12-19 DIAGNOSIS — F1021 Alcohol dependence, in remission: Secondary | ICD-10-CM | POA: Diagnosis not present

## 2016-12-19 DIAGNOSIS — F4310 Post-traumatic stress disorder, unspecified: Secondary | ICD-10-CM | POA: Diagnosis not present

## 2016-12-19 DIAGNOSIS — F1121 Opioid dependence, in remission: Secondary | ICD-10-CM | POA: Diagnosis not present

## 2016-12-19 MED FILL — SUBOXONE 8 MG-2 MG SL FILM: 8-2 | 30 days supply | Qty: 90 | Fill #0

## 2017-01-01 DIAGNOSIS — F4310 Post-traumatic stress disorder, unspecified: Secondary | ICD-10-CM | POA: Diagnosis not present

## 2017-01-08 DIAGNOSIS — F4310 Post-traumatic stress disorder, unspecified: Secondary | ICD-10-CM | POA: Diagnosis not present

## 2017-01-16 MED FILL — SUBOXONE 8 MG-2 MG SL FILM: 8-2 | 30 days supply | Qty: 90 | Fill #0

## 2017-01-16 MED FILL — traZODone HCL 50 MG TABS: 50 | 30 days supply | Qty: 30 | Fill #1

## 2017-01-16 MED FILL — VIIBRYD 40 MG TABLET: 40 | 30 days supply | Qty: 30 | Fill #2

## 2017-02-01 DIAGNOSIS — F4310 Post-traumatic stress disorder, unspecified: Secondary | ICD-10-CM | POA: Diagnosis not present

## 2017-02-01 DIAGNOSIS — F1121 Opioid dependence, in remission: Secondary | ICD-10-CM | POA: Diagnosis not present

## 2017-02-01 DIAGNOSIS — F1021 Alcohol dependence, in remission: Secondary | ICD-10-CM | POA: Diagnosis not present

## 2017-02-08 DIAGNOSIS — F4310 Post-traumatic stress disorder, unspecified: Secondary | ICD-10-CM | POA: Diagnosis not present

## 2017-02-15 ENCOUNTER — Ambulatory Visit: Payer: 59 | Admitting: Family Medicine

## 2017-02-19 MED FILL — SUBOXONE 8 MG-2 MG SL FILM: 8-2 | 30 days supply | Qty: 90 | Fill #0

## 2017-02-21 DIAGNOSIS — F1121 Opioid dependence, in remission: Secondary | ICD-10-CM | POA: Diagnosis not present

## 2017-02-21 DIAGNOSIS — G894 Chronic pain syndrome: Secondary | ICD-10-CM | POA: Diagnosis not present

## 2017-02-21 DIAGNOSIS — F4312 Post-traumatic stress disorder, chronic: Secondary | ICD-10-CM | POA: Diagnosis not present

## 2017-02-22 MED FILL — VIIBRYD 40 MG TABLET: 40 | 30 days supply | Qty: 30 | Fill #0

## 2017-02-26 MED FILL — SILDENAFIL CITRATE 100 MG T: 100 | 60 days supply | Qty: 12 | Fill #0

## 2017-02-28 DIAGNOSIS — F4310 Post-traumatic stress disorder, unspecified: Secondary | ICD-10-CM | POA: Diagnosis not present

## 2017-03-07 DIAGNOSIS — F331 Major depressive disorder, recurrent, moderate: Secondary | ICD-10-CM | POA: Diagnosis not present

## 2017-03-07 DIAGNOSIS — F4312 Post-traumatic stress disorder, chronic: Secondary | ICD-10-CM | POA: Diagnosis not present

## 2017-03-07 DIAGNOSIS — F4310 Post-traumatic stress disorder, unspecified: Secondary | ICD-10-CM | POA: Diagnosis not present

## 2017-03-14 DIAGNOSIS — F4312 Post-traumatic stress disorder, chronic: Secondary | ICD-10-CM | POA: Diagnosis not present

## 2017-03-14 DIAGNOSIS — F331 Major depressive disorder, recurrent, moderate: Secondary | ICD-10-CM | POA: Diagnosis not present

## 2017-03-14 DIAGNOSIS — F4310 Post-traumatic stress disorder, unspecified: Secondary | ICD-10-CM | POA: Diagnosis not present

## 2017-03-15 ENCOUNTER — Ambulatory Visit: Payer: 59 | Admitting: Family Medicine

## 2017-03-15 DIAGNOSIS — F1021 Alcohol dependence, in remission: Secondary | ICD-10-CM | POA: Diagnosis not present

## 2017-03-15 DIAGNOSIS — F1121 Opioid dependence, in remission: Secondary | ICD-10-CM | POA: Diagnosis not present

## 2017-03-15 DIAGNOSIS — F4310 Post-traumatic stress disorder, unspecified: Secondary | ICD-10-CM | POA: Diagnosis not present

## 2017-03-19 DIAGNOSIS — F1121 Opioid dependence, in remission: Secondary | ICD-10-CM | POA: Diagnosis not present

## 2017-03-19 DIAGNOSIS — F4312 Post-traumatic stress disorder, chronic: Secondary | ICD-10-CM | POA: Diagnosis not present

## 2017-03-21 DIAGNOSIS — F331 Major depressive disorder, recurrent, moderate: Secondary | ICD-10-CM | POA: Diagnosis not present

## 2017-03-21 DIAGNOSIS — F4312 Post-traumatic stress disorder, chronic: Secondary | ICD-10-CM | POA: Diagnosis not present

## 2017-04-02 DIAGNOSIS — F4310 Post-traumatic stress disorder, unspecified: Secondary | ICD-10-CM | POA: Diagnosis not present

## 2017-04-03 DIAGNOSIS — F4312 Post-traumatic stress disorder, chronic: Secondary | ICD-10-CM | POA: Diagnosis not present

## 2017-04-03 DIAGNOSIS — F1021 Alcohol dependence, in remission: Secondary | ICD-10-CM | POA: Diagnosis not present

## 2017-04-03 DIAGNOSIS — F1121 Opioid dependence, in remission: Secondary | ICD-10-CM | POA: Diagnosis not present

## 2017-04-03 MED FILL — SUBOXONE 8 MG-2 MG SL FILM: 8-2 | 30 days supply | Qty: 90 | Fill #0

## 2017-04-17 DIAGNOSIS — F4310 Post-traumatic stress disorder, unspecified: Secondary | ICD-10-CM | POA: Diagnosis not present

## 2017-05-07 DIAGNOSIS — F4312 Post-traumatic stress disorder, chronic: Secondary | ICD-10-CM | POA: Diagnosis not present

## 2017-05-07 DIAGNOSIS — F1121 Opioid dependence, in remission: Secondary | ICD-10-CM | POA: Diagnosis not present

## 2017-08-17 DIAGNOSIS — F4310 Post-traumatic stress disorder, unspecified: Secondary | ICD-10-CM | POA: Diagnosis not present

## 2018-04-22 ENCOUNTER — Encounter: Payer: Self-pay | Admitting: Family Medicine

## 2018-04-22 ENCOUNTER — Ambulatory Visit (INDEPENDENT_AMBULATORY_CARE_PROVIDER_SITE_OTHER): Payer: No Typology Code available for payment source | Admitting: Family Medicine

## 2018-04-22 ENCOUNTER — Other Ambulatory Visit: Payer: Self-pay

## 2018-04-22 VITALS — BP 110/68 | HR 96 | Temp 98.4°F | Ht 72.0 in | Wt 229.1 lb

## 2018-04-22 DIAGNOSIS — R6889 Other general symptoms and signs: Secondary | ICD-10-CM

## 2018-04-22 MED ORDER — PROMETHAZINE-PHENYLEPH-CODEINE 6.25-5-10 MG/5ML PO SYRP: 5.0000 mL | ORAL_SOLUTION | Freq: Three times a day (TID) | ORAL | 0 refills | Status: DC | PRN

## 2018-04-22 MED ORDER — METHYLPREDNISOLONE ACETATE 80 MG/ML IJ SUSP
80.0000 mg | Freq: Once | INTRAMUSCULAR | Status: AC
  Administered 2018-04-22: 80 mg via INTRAMUSCULAR

## 2018-04-22 MED ORDER — OSELTAMIVIR PHOSPHATE 75 MG PO CAPS: 75.0000 mg | ORAL_CAPSULE | Freq: Two times a day (BID) | ORAL | 0 refills | Status: AC

## 2018-04-22 MED ORDER — PROMETHAZINE VC/CODEINE 6.25-5-10 MG/5ML PO SYRP: 5.0000 mL | ORAL_SOLUTION | Freq: Three times a day (TID) | ORAL | 0 refills | Status: AC | PRN

## 2018-04-22 MED FILL — OSELTAMIVIR PHOSPHATE 75 MG: 75 | 5 days supply | Qty: 10 | Fill #0

## 2018-04-22 MED FILL — PROMETHAZINE W/COD SYRUP: 6.25-10 | 8 days supply | Qty: 118 | Fill #0

## 2018-04-22 NOTE — Patient Instructions (Signed)
Please consider counseling. Contact [phone] to schedule an appointment or inquire about cost/insurance coverage.  Continue to push fluids, practice good hand hygiene, and cover your mouth if you cough.  If you start having fevers, shaking or shortness of breath, seek immediate care.  OK to take Tylenol 1000 mg (2 extra strength tabs) or 975 mg (3 regular strength tabs) every 6 hours as needed.  Ibuprofen 400-600 mg (2-3 over the counter strength tabs) every 6 hours as needed for pain.  Do not drink alcohol, do any illicit/street drugs, drive or do anything that requires alertness while on this cough medicine.   Let us know if you need anything.

## 2018-04-22 NOTE — Addendum Note (Signed)
Addended by: Radene Gunning on: 04/22/2018 10:07 AM   Modules accepted: Orders

## 2018-04-22 NOTE — Progress Notes (Signed)
Chief Complaint  Patient presents with  . Nasal Congestion  . Cough  . Fatigue  . Generalized Body Aches  . Night Sweats    Karl Jacobs here for URI complaints.  Duration: 2 days  Associated symptoms: cold sweats, 99 F at home, sinus congestion, myalgia and cough Denies: sinus pain, ear fullness, ear pain, ear drainage, wheezing and shortness of breath Treatment to date: ibuprofen, Mucinex, Theraflu Sick contacts: No  ROS:  Const: +subj fevers HEENT: As noted in HPI Lungs: +cough  Past Medical History:  Diagnosis Date  . Chronic back pain   . Depression   . Hyperlipidemia, mixed 04/18/2015  . Neck pain 04/10/2016  . Post traumatic stress disorder (PTSD)    H/O .Marland Kitchen...  "FELT BETTER IN 2006"  . Tobacco smoker within last 12 months 04/10/2016    BP 110/68 (BP Location: Left Arm, Patient Position: Sitting, Cuff Size: Large)   Pulse 96   Temp 98.4 F (36.9 C) (Oral)   Ht 6' (1.829 m)   Wt 229 lb 2 oz (103.9 kg)   SpO2 96%   BMI 31.07 kg/m  General: Awake, alert, appears stated age HEENT: AT, [place], ears patent b/l and TM's neg, nares patent w/o discharge, pharynx pink and without exudates, MMM Neck: No masses or asymmetry Heart: RRR Lungs: CTAB, no accessory muscle use Psych: Age appropriate judgment and insight, normal mood and affect  Flu-like symptoms - Plan: Promethazine-Phenyleph-Codeine 6.25-5-10 MG/5ML SYRP, oseltamivir (TAMIFLU) 75 MG capsule, methylPREDNISolone acetate (DEPO-MEDROL) injection 80 mg  Orders as above. Continue to push fluids, practice good hand hygiene, cover mouth when coughing. Ibuprofen, Tylenol. Warning about Codeine syrup verbalized and written down. Briefly mentioned he has been having increased stress at work and requested counseling services. [facility] # provided.  F/u prn. If starting to experience fevers, shaking, or shortness of breath, seek immediate care. Pt voiced understanding and agreement to the plan.  Karl Roche Jerome,  DO 04/22/18 9:08 AM D

## 2018-04-23 ENCOUNTER — Telehealth: Payer: Self-pay

## 2018-04-23 NOTE — Telephone Encounter (Signed)
Patient seen Karl Jacobs   Please advise

## 2018-04-23 NOTE — Telephone Encounter (Signed)
Copied from CRM [id number]. Topic: General - Other >> Apr 23, 2018  9:18 AM Arlyss Gandy, NT wrote: Reason for CRM: Pt states that the work note he received stated to be out until 04/25/2018, but he is wanting to see if it can be wrote to be out until 04/26/2018? Please advise.

## 2018-04-24 ENCOUNTER — Encounter: Payer: Self-pay | Admitting: Family Medicine

## 2018-04-24 NOTE — Telephone Encounter (Signed)
Printed letter///put at the front desk for [employer] the patient left detailed message

## 2018-04-24 NOTE — Telephone Encounter (Signed)
That's fine TY

## 2018-04-25 ENCOUNTER — Other Ambulatory Visit: Payer: Self-pay

## 2018-04-25 ENCOUNTER — Ambulatory Visit (INDEPENDENT_AMBULATORY_CARE_PROVIDER_SITE_OTHER): Payer: No Typology Code available for payment source | Admitting: Family Medicine

## 2018-04-25 ENCOUNTER — Encounter: Payer: Self-pay | Admitting: Family Medicine

## 2018-04-25 DIAGNOSIS — F17200 Nicotine dependence, unspecified, uncomplicated: Secondary | ICD-10-CM | POA: Diagnosis not present

## 2018-04-25 DIAGNOSIS — F4310 Post-traumatic stress disorder, unspecified: Secondary | ICD-10-CM

## 2018-04-25 DIAGNOSIS — R6889 Other general symptoms and signs: Secondary | ICD-10-CM | POA: Diagnosis not present

## 2018-04-25 MED ORDER — OLANZAPINE 2.5 MG PO TABS: 2.5000 mg | ORAL_TABLET | Freq: Every day | ORAL | 2 refills | Status: AC

## 2018-04-25 MED ORDER — ESCITALOPRAM OXALATE 10 MG PO TABS: 10.0000 mg | ORAL_TABLET | Freq: Every day | ORAL | 2 refills | Status: AC

## 2018-04-25 NOTE — Patient Instructions (Signed)
Living With Post-Traumatic Stress Disorder  If you have been diagnosed with post-traumatic stress disorder (PTSD), you may be relieved that you now know why you have felt or behaved a certain way. Still, you may feel overwhelmed about the treatment ahead. You may also wonder how to get the support you need and how to deal with the condition day-to-day.  If you are living with PTSD, there are ways to help you recover from it and manage your symptoms.  How to manage lifestyle changes  Managing stress  Stress is your body's reaction to life changes and events, both good and bad. Stress can make PTSD worse. Take the following steps to cope with stress:   Talk with your health care provider or a counselor if you would like to learn more about techniques to reduce your stress. He or she may suggest some stress reduction techniques such as:  ? Muscle relaxation exercises.  ? Regular exercise.  ? Meditation, yoga, or other mind-body exercises.  ? Breathing exercises.  ? Listening to quiet music.  ? Spending time outside.   Maintain a healthy lifestyle. Eat a healthy diet, exercise regularly, get plenty of sleep, and take time to relax.   Spend time with others. Talk with them about how you are feeling and what kind of support you need. Try to not isolate yourself, even though you may feel like doing that. Isolating yourself can delay your recovery.   Do activities and hobbies that you enjoy.   Pace yourself when doing stressful things. Take breaks, and reward yourself when you finish. Make sure that you do not overload your schedule.    Medicines  Your health care provider may suggest certain medicines if he or she feels that they will help to improve your condition. Antidepressants or antipsychotic medicines may be used to treat PTSD. Avoid using alcohol and other substances that may prevent your medicines from working properly (may interact). It is also important to:   Talk with your pharmacist or health care  provider about all medicines that you take, their possible side effects, and which medicines are safe to take together.   Make it your goal to take part in all treatment decisions (shared decision-making). Ask about possible side effects of medicines that your health care provider recommends, and tell him or her how you feel about having those side effects. It is best if shared decision-making with your health care provider is part of your total treatment plan.  If your health care provider prescribes a medicine, you may not notice the full benefits of it for 4-8 weeks. Most people who are treated for PTSD need to take medicine for at least 6-12 months after they feel better. If you are taking medicines as part of your treatment, do not stop taking medicines before you ask your health care provider if it is safe to stop. You may need to have the medicine slowly decreased (tapered) over time to lower the risk of harmful side effects.  Relationships  Many people who have PTSD have difficulty trusting others. Make an effort to:   Take risks and develop trust with close friends and family members. Developing trust in others can help you feel safe and connect you with emotional support.   Be open and honest about your feelings.   Try to have fun and relax in safe spaces, such as with friends and family.   Think about going to couples counseling, family education classes, or family therapy. Your loved   ones may not always know how to be supportive. Therapy can be helpful for everyone.  How to recognize changes in your condition  Be aware of your symptoms and how often you have them. The following symptoms mean that you need to seek help for your PTSD:   You feel suspicious and angry.   You have repeated flashbacks.   You avoid going out or being with others.   You have an increasing number of fights with close friends or family members, such as your spouse.   You have thoughts about hurting yourself or  others.   You cannot get relief from feelings of depression or anxiety.  Where to find support  Talking to others   Explain that PTSD is a mental health problem. It is something that a person can develop after experiencing or seeing a life-threatening event. Tell them that PTSD makes you feel stress like you did during the event.   Talk to your loved ones about the symptoms you have. Also tell them what things or situations can cause symptoms to start (are triggers for you).   Assure your loved ones that there are treatments to help PTSD. Discuss possibly seeking family therapy or couples therapy.   If you are worried or fearful about seeking treatment, ask for support.  Finances  Not all insurance plans cover mental health care, so it is important to check with your insurance carrier. If paying for co-pays or counseling services is a problem, search for a local or county mental health care center. Public mental health care services may be offered there at a low cost or no cost when you are not able to see a private health care provider. If you are a veteran, contact a local veterans organization or veterans hospital for more information.  If you are taking medicine for PTSD, you may be able to get the genericform, which may be less expensive than brand-name medicine. Some makers of prescription medicines also offer help to patients who cannot afford the medicines that they need.  Community resources   Find a support group in your community. Often, groups are available for military veterans, trauma victims, and family members or caregivers.   Look into volunteer opportunities. Taking part in these can help you feel more connected to your community.   Contact a local organization to find out if you are eligible for a service dog.   Keep daily contact with at least one trusted friend or family member.  Follow these instructions at home:  Lifestyle   Exercise regularly. Try to do 30 or more minutes of  physical activity on most days of the week.   Try to get 7-9 hours of sleep each night. To help with sleep:  ? Keep your bedroom cool and dark.  ? Do not eat a heavy meal during the hour before you go to bed.  ? Do not drink alcohol or caffeinated drinks before bed.  ? Avoid screen time before bedtime. This means avoiding use of your TV, computer, tablet, and cell phone.   Avoid using alcohol or drugs.   Practice self-soothing skills and use them daily.   Try to have fun and seek humor in your life.  General instructions   If your PTSD is affecting your marriage or family, seek help from a family therapist.   Take over-the-counter and prescription medicines only as told by your health care provider.   Make sure to let all of your health care providers   know that you have PTSD. This is especially important if you are having surgery or need to be admitted to the hospital.   Keep all follow-up visits as told by your health care providers. This is important.  Where to find more information  Go to this website to find more information about PTSD, treatment for PTSD, and how to get support:   National Center for PTSD: www.ptsd.va.gov  Contact a health care provider if:   Your symptoms get worse or they do not get better.  Get help right away if:   You have thoughts about hurting yourself or others.  If you ever feel like you may hurt yourself or others, or have thoughts about taking your own life, get help right away. You can go to your nearest emergency department or call:   Your local emergency services (911 in the [place]   A suicide crisis helpline, such as the National Suicide Prevention Lifeline at 1-800-273-8255. This is open 24-hours a day.  Summary   If you are living with PTSD, there are ways to help you recover from it and manage your symptoms.   Find supportive environments and people who understand PTSD. Spend time in those places, and maintain contact with those people.   Work with your health  care team to create a management plan that includes counseling, stress management techniques, and healthy lifestyle habits.  This information is not intended to replace advice given to you by your health care provider. Make sure you discuss any questions you have with your health care provider.  Document Released: 05/25/2016 Document Revised: 05/25/2016 Document Reviewed: 05/25/2016  Elsevier Interactive Patient Education  2019 [employer]
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
## 2018-04-26 DIAGNOSIS — R6889 Other general symptoms and signs: Secondary | ICD-10-CM | POA: Insufficient documentation

## 2018-04-26 NOTE — Assessment & Plan Note (Signed)
Symptoms have resolved essentially and he feels better.
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
## 2018-04-26 NOTE — Assessment & Plan Note (Signed)
Encouraged complete cessation immediately due to Covid. He is encouraged to try patches or gum. Spent 5 minutes discussing

## 2018-05-13 ENCOUNTER — Ambulatory Visit: Payer: No Typology Code available for payment source | Admitting: Psychology

## 2018-06-04 ENCOUNTER — Telehealth: Payer: Self-pay | Admitting: *Deleted

## 2018-06-04 NOTE — Telephone Encounter (Signed)
Received Physician Orders from [facility]; forwarded to provider/SLS 04/28

## 2018-06-13 ENCOUNTER — Ambulatory Visit: Payer: Self-pay | Admitting: Family Medicine

## 2019-01-08 ENCOUNTER — Other Ambulatory Visit: Payer: Self-pay

## 2019-01-08 ENCOUNTER — Encounter: Payer: Self-pay | Admitting: Family

## 2019-01-08 ENCOUNTER — Ambulatory Visit (INDEPENDENT_AMBULATORY_CARE_PROVIDER_SITE_OTHER): Payer: No Typology Code available for payment source | Admitting: Family

## 2019-01-08 DIAGNOSIS — R067 Sneezing: Secondary | ICD-10-CM

## 2019-01-08 DIAGNOSIS — M549 Dorsalgia, unspecified: Secondary | ICD-10-CM

## 2019-01-08 MED ORDER — METHYLPREDNISOLONE 4 MG PO TBPK: ORAL_TABLET | ORAL | 0 refills | Status: AC

## 2019-01-08 MED ORDER — CYCLOBENZAPRINE HCL 5 MG PO TABS: 5.0000 mg | ORAL_TABLET | Freq: Every evening | ORAL | 0 refills | Status: AC | PRN

## 2019-01-08 MED FILL — METHYLPREDNISOLONE 4 MG TBP: 4 | 6 days supply | Qty: 21 | Fill #0

## 2019-01-08 MED FILL — CYCLOBENZAPRINE HCL 5 MG TA: 5 | 14 days supply | Qty: 14 | Fill #0

## 2019-01-08 NOTE — Progress Notes (Signed)
Virtual Visit via Video Note  I connected with Karl Jacobs on 01/08/19 at 12:20 PM EST by a video enabled telemedicine application and verified that I am speaking with the correct person using two identifiers.  Location: Patient: work Restaurant manager, fast food: work   I discussed the limitations of evaluation and management by telemedicine and the availability of in person appointments. The patient expressed understanding and agreed to proceed.  History of Present Illness:  Patient is a 53 year old male who presents today with chief complaint of back pain.  He reports history of chronic back issues including previous surgeries and implantation of a spinal cord stimulator.  However his back pain has worsened over the last 2 weeks.  The pain seems to been triggered by recent increase in sneezing.  Back pain is located in the middle of his back and in his lower back.  He reports that he is sneezing about 20 times a day.  He has single very hard loud sneezes which are quite jarring to his back pain.  He has tried multiple over-the-counter agents to help with the sneezing including Sudafed, Claritin, and Benadryl.  He has not found any improvement with these measures.  In addition he is not sleeping well due to the back pain and this is causing him to feel fatigued.  He reports that he was tested for Covid 2 weeks ago and was negative.   Past Medical History:  Diagnosis Date  . Chronic back pain   . Depression   . Hyperlipidemia, mixed 04/18/2015  . Neck pain 04/10/2016  . Post traumatic stress disorder (PTSD)    H/O .Marland Kitchen...  "FELT BETTER IN 2006"  . Tobacco smoker within last 12 months 04/10/2016     Social History   Socioeconomic History  . Marital status: Married    Spouse name: Not on file  . Number of children: Not on file  . Years of education: Not on file  . Highest education level: Not on file  Occupational History  . Occupation: Research officer, trade union  Social Needs  . Financial resource strain: Not  on file  . Food insecurity    Worry: Not on file    Inability: Not on file  . Transportation needs    Medical: Not on file    Non-medical: Not on file  Tobacco Use  . Smoking status: Current Every Day Smoker    Packs/day: 1.00    Years: 33.00    Pack years: 33.00    Types: Cigarettes    Last attempt to quit: 11/06/2012    Years since quitting: 6.1  . Smokeless tobacco: Never Used  . Tobacco comment: quit back in 2014 and started back 2016  Substance and Sexual Activity  . Alcohol use: No  . Drug use: No  . Sexual activity: Yes    Birth control/protection: None    Comment: lives with wife, daughers, son  (when from college), no dietary restrictions  Lifestyle  . Physical activity    Days per week: Not on file    Minutes per session: Not on file  . Stress: Not on file  Relationships  . Social Musician on phone: Not on file    Gets together: Not on file    Attends religious service: Not on file    Active member of club or organization: Not on file    Attends meetings of clubs or organizations: Not on file    Relationship status: Not on file  . Intimate  partner violence    Fear of current or ex partner: Not on file    Emotionally abused: Not on file    Physically abused: Not on file    Forced sexual activity: Not on file  Other Topics Concern  . Not on file  Social History Narrative  . Not on file    Past Surgical History:  Procedure Laterality Date  . APPENDECTOMY    . BACK SURGERY     X 3  . SPINAL CORD STIMULATOR INSERTION  10/19/2011   Procedure: LUMBAR SPINAL CORD STIMULATOR INSERTION;  Surgeon: Melina Schools, MD;  Location: [facility];  Service: Orthopedics;  Laterality: N/A;  SPINAL CORD STIMULATOR PLACEMENT  . TONSILLECTOMY    . TONSILLECTOMY    . VASECTOMY     & REVERSAL IN 2008  . WISDOM TOOTH EXTRACTION      Family History  Problem Relation Age of Onset  . Other Mother        Paget's Disease   . Hypertension Father   . Diabetes Father         type 2  . Cancer Father        hodgkins lymphoma  . Hyperlipidemia Father   . Arthritis Father   . Other Paternal Grandfather        carbon monoxide  . Stroke Paternal Grandfather   . Alcohol abuse Brother   . Cancer Brother 48       colon ca, polyp  . Stroke Maternal Grandfather        ?  Marland Kitchen Pneumonia Paternal Grandmother   . Healthy Son 38  . Healthy Daughter 35  . Healthy Daughter 3    No Known Allergies  Current Outpatient Medications on File Prior to Visit  Medication Sig Dispense Refill  . escitalopram (LEXAPRO) 10 MG tablet Take 1 tablet (10 mg total) by mouth daily. 30 tablet 2  . OLANZapine (ZYPREXA) 2.5 MG tablet Take 1 tablet (2.5 mg total) by mouth at bedtime. 30 tablet 2  . Promethazine-Phenyleph-Codeine (PROMETHAZINE VC/CODEINE) 6.25-5-10 MG/5ML SYRP Take 5 mLs by mouth every 8 (eight) hours as needed (Cough). 118 mL 0   No current facility-administered medications on file prior to visit.     There were no vitals taken for this visit.   Observations/Objective:   Gen: Awake, alert, no acute distress Resp: Breathing is even and non-labored Psych: calm/pleasant demeanor Neuro: Alert and Oriented x 3, + facial symmetry, speech is clear.   Assessment and Plan:  Acute on chronic back pain- trial of medrol dose pak, hs flexeril. Call if symptoms worsen or if not improved in 1 week.  Sneezing- advised trial of claritin once daily and flonase 2 sprays each nostril once daily.   Follow Up Instructions:    I discussed the assessment and treatment plan with the patient. The patient was provided an opportunity to ask questions and all were answered. The patient agreed with the plan and demonstrated an understanding of the instructions.   The patient was advised to call back or seek an in-person evaluation if the symptoms worsen or if the condition fails to improve as anticipated.  Nance Pear, NP

## 2020-05-24 ENCOUNTER — Other Ambulatory Visit: Payer: Self-pay

## 2020-05-24 ENCOUNTER — Encounter: Payer: Self-pay | Admitting: Family Medicine

## 2020-05-24 ENCOUNTER — Ambulatory Visit: Payer: Self-pay

## 2020-05-24 ENCOUNTER — Ambulatory Visit (INDEPENDENT_AMBULATORY_CARE_PROVIDER_SITE_OTHER): Payer: 59 | Admitting: Family Medicine

## 2020-05-24 VITALS — BP 118/76 | Ht 72.0 in | Wt 215.0 lb

## 2020-05-24 DIAGNOSIS — M25562 Pain in left knee: Secondary | ICD-10-CM

## 2020-05-24 DIAGNOSIS — M7052 Other bursitis of knee, left knee: Secondary | ICD-10-CM | POA: Diagnosis not present

## 2020-05-24 MED ORDER — METHYLPREDNISOLONE ACETATE 40 MG/ML IJ SUSP
40.0000 mg | Freq: Once | INTRAMUSCULAR | Status: AC
  Administered 2020-05-24: 40 mg via INTRA_ARTICULAR

## 2020-05-24 NOTE — Assessment & Plan Note (Signed)
Has a bursitis of the lateral aspect and is area of most intense pain.  Knee exam was reassuring. -Counseled on home exercise therapy and supportive care. -Injection. -Counseled on cushioning the area. -Could consider imaging or physical therapy.

## 2020-05-24 NOTE — Patient Instructions (Signed)
Nice to meet you Please try ice as needed  Please try cushion on the area  Please send me a message in MyChart with any questions or updates.  Please see me back in 4 weeks.   --Dr. Jordan Likes

## 2020-05-24 NOTE — Progress Notes (Signed)
Karl Jacobs - 55 y.o. male MRN [id number]  Date of birth: January 10, 1966  SUBJECTIVE:  Including CC & ROS.  No chief complaint on file.   Karl Jacobs is a 55 y.o. male that is presenting with left knee pain.  The pain has been ongoing for about 3 weeks.  Having some pain to the touch on the left lateral aspect of the knee.  No history of similar pain.   Review of Systems See HPI   HISTORY: Past Medical, Surgical, Social, and Family History Reviewed & Updated per EMR.   Pertinent Historical Findings include:  Past Medical History:  Diagnosis Date  . Chronic back pain   . Depression   . Hyperlipidemia, mixed 04/18/2015  . Neck pain 04/10/2016  . Post traumatic stress disorder (PTSD)    H/O .Marland Kitchen...  "FELT BETTER IN 2006"  . Tobacco smoker within last 12 months 04/10/2016    Past Surgical History:  Procedure Laterality Date  . APPENDECTOMY    . BACK SURGERY     X 3  . SPINAL CORD STIMULATOR INSERTION  10/19/2011   Procedure: LUMBAR SPINAL CORD STIMULATOR INSERTION;  Surgeon: Venita Lick, MD;  Location: [facility];  Service: Orthopedics;  Laterality: N/A;  SPINAL CORD STIMULATOR PLACEMENT  . TONSILLECTOMY    . TONSILLECTOMY    . VASECTOMY     & REVERSAL IN 2008  . WISDOM TOOTH EXTRACTION      Family History  Problem Relation Age of Onset  . Other Mother        Paget's Disease   . Hypertension Father   . Diabetes Father        type 2  . Cancer Father        hodgkins lymphoma  . Hyperlipidemia Father   . Arthritis Father   . Other Paternal Grandfather        carbon monoxide  . Stroke Paternal Grandfather   . Alcohol abuse Brother   . Cancer Brother 48       colon ca, polyp  . Stroke Maternal Grandfather        ?  Marland Kitchen Pneumonia Paternal Grandmother   . Healthy Son 59  . Healthy Daughter 30  . Healthy Daughter 3    Social History   Socioeconomic History  . Marital status: Married    Spouse name: Not on file  . Number of children: Not on file  . Years of  education: Not on file  . Highest education level: Not on file  Occupational History  . Occupation: Research officer, trade union  Tobacco Use  . Smoking status: Current Every Day Smoker    Packs/day: 1.00    Years: 33.00    Pack years: 33.00    Types: Cigarettes    Last attempt to quit: 11/06/2012    Years since quitting: 7.5  . Smokeless tobacco: Never Used  . Tobacco comment: quit back in 2014 and started back 2016  Substance and Sexual Activity  . Alcohol use: No  . Drug use: No  . Sexual activity: Yes    Birth control/protection: None    Comment: lives with wife, daughers, son  (when from college), no dietary restrictions  Other Topics Concern  . Not on file  Social History Narrative  . Not on file   Social Determinants of Health   Financial Resource Strain: Not on file  Food Insecurity: Not on file  Transportation Needs: Not on file  Physical Activity: Not on file  Stress: Not  on file  Social Connections: Not on file  Intimate Partner Violence: Not on file     PHYSICAL EXAM:  VS: BP 118/76 (BP Location: Left Arm, Patient Position: Sitting, Cuff Size: Large)   Ht 6' (1.829 m)   Wt 215 lb (97.5 kg)   BMI 29.16 kg/m  Physical Exam Gen: NAD, alert, cooperative with exam, well-appearing MSK:  Left knee: No effusion. Normal range of motion. Tenderness to palpation over the lateral joint space. Neurovascular intact  Limited ultrasound: Left knee:  No effusion in the suprapatellar pouch. Normal-appearing quadricep and patellar tendon. Normal-appearing medial joint space. Normal-appearing lateral meniscus. Hypoechoic changes lateral to the insertion of patellar tendon to represent a bursitis.  This is area of most intense pain.  Summary: Findings seem most consistent with infrapatellar bursitis.  Ultrasound and interpretation by Clare Gandy, MD   Aspiration/Injection Procedure Note Karl Jacobs [birth year]  Procedure: Injection Indications: left knee pain    Procedure Details Consent: Risks of procedure as well as the alternatives and risks of each were explained to the (patient/caregiver).  Consent for procedure obtained. Time Out: Verified patient identification, verified procedure, site/side was marked, verified correct patient position, special equipment/implants available, medications/allergies/relevent history reviewed, required imaging and test results available.  Performed.  The area was cleaned with iodine and alcohol swabs.    The left knee infrapatellar bursa was injected using 1 cc's of 40 mg  Depo-Medrol and 1 cc's of 0.25% bupivacaine with a 25 1 1/2" needle.  Ultrasound was used. Images were obtained in long views showing the injection.     A sterile dressing was applied.  Patient did tolerate procedure well.    ASSESSMENT & PLAN:   Infrapatellar bursitis of left knee Has a bursitis of the lateral aspect and is area of most intense pain.  Knee exam was reassuring. -Counseled on home exercise therapy and supportive care. -Injection. -Counseled on cushioning the area. -Could consider imaging or physical therapy.

## 2020-06-28 ENCOUNTER — Ambulatory Visit: Payer: Self-pay | Admitting: Family Medicine

## 2022-05-22 ENCOUNTER — Encounter: Payer: Self-pay | Admitting: *Deleted
